# Patient Record
Sex: Female | Born: 1995 | Race: Black or African American | Hispanic: No | Marital: Single | Smoking: Never smoker
Health system: Southern US, Community
[De-identification: ages and names within clinical notes are randomized; demographics above are authoritative.]

## PROBLEM LIST (undated history)

## (undated) ENCOUNTER — Ambulatory Visit (HOSPITAL_COMMUNITY)
Admission: RE | Disposition: A | Discharge status: Home / Self Care | Payer: PRIVATE HEALTH INSURANCE | Source: Ambulatory Visit | Attending: Family Medicine | Admitting: Family Medicine

## (undated) DIAGNOSIS — Z789 Other specified health status: Secondary | ICD-10-CM

## (undated) HISTORY — PX: WISDOM TOOTH EXTRACTION: SHX21

---
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
## 2016-03-21 ENCOUNTER — Encounter (HOSPITAL_COMMUNITY): Payer: Self-pay | Admitting: Family Medicine

## 2016-03-21 ENCOUNTER — Ambulatory Visit (HOSPITAL_COMMUNITY)
Admission: EM | Admit: 2016-03-21 | Discharge: 2016-03-21 | Disposition: A | Discharge status: Home / Self Care | Payer: No Typology Code available for payment source | Attending: Family Medicine | Admitting: Family Medicine

## 2016-03-21 DIAGNOSIS — R109 Unspecified abdominal pain: Secondary | ICD-10-CM | POA: Diagnosis not present

## 2016-03-21 DIAGNOSIS — N938 Other specified abnormal uterine and vaginal bleeding: Secondary | ICD-10-CM

## 2016-03-21 NOTE — ED Provider Notes (Addendum)
[facility]    CSN: [id number] Arrival date & time: 03/21/16  1840     History   Chief Complaint Chief Complaint  Patient presents with  . Abdominal Pain  . Vaginal Bleeding    HPI Julia Hardy is a 21 y.o. female.   The history is provided by the patient.  Abdominal Pain  Pain location:  Suprapubic Pain quality: cramping   Pain radiates to:  Does not radiate Pain severity:  Mild Onset quality:  Gradual Duration:  3 days Progression:  Partially resolved Chronicity:  New Context comment:  Lesbian on no bcp, lighter bleeding than nl menses Relieved by:  None tried Worsened by:  Nothing Ineffective treatments:  None tried Associated symptoms: vaginal bleeding   Associated symptoms: no dysuria, no nausea, no vaginal discharge and no vomiting   Vaginal Bleeding  Associated symptoms: abdominal pain   Associated symptoms: no dysuria, no nausea and no vaginal discharge     History reviewed. No pertinent past medical history.  Patient Active Problem List   Diagnosis Date Noted  . Anal fissure 09/19/2013    History reviewed. No pertinent surgical history.  OB History    No data available       Home Medications    Prior to Admission medications   Medication Sig Start Date End Date Taking? Authorizing Provider  cephALEXin (KEFLEX) 500 MG capsule Take 1 capsule (500 mg total) by mouth 4 (four) times daily. 09/21/15   Roxy Horseman, PA-C  clotrimazole (LOTRIMIN) 1 % external solution Apply 1 application topically 2 (two) times daily. In between toes 06/08/15   Asencion Islam, DPM    Family History Family History  Problem Relation Age of Onset  . Diabetes Mother     Social History Social History  Substance Use Topics  . Smoking status: Never Smoker  . Smokeless tobacco: Never Used  . Alcohol use No     Allergies   Patient has no known allergies.   Review of Systems Review of Systems  Constitutional: Negative.   Gastrointestinal:  Positive for abdominal pain. Negative for nausea and vomiting.  Genitourinary: Positive for menstrual problem and vaginal bleeding. Negative for dysuria, flank pain, pelvic pain, urgency and vaginal discharge.  All other systems reviewed and are negative.    Physical Exam Triage Vital Signs ED Triage Vitals [03/21/16 2031]  Enc Vitals Group     BP 126/71     Pulse Rate 76     Resp 18     Temp 98.3 F (36.8 C)     Temp src      SpO2 100 %     Weight      Height      Head Circumference      Peak Flow      Pain Score      Pain Loc      Pain Edu?      Excl. in GC?    No data found.   Updated Vital Signs BP 126/71   Pulse 76   Temp 98.3 F (36.8 C)   Resp 18   LMP 03/21/2016   SpO2 100%   Visual Acuity Right Eye Distance:   Left Eye Distance:   Bilateral Distance:    Right Eye Near:   Left Eye Near:    Bilateral Near:     Physical Exam  Constitutional: She is oriented to person, place, and time. She appears well-developed and well-nourished. No distress.  Abdominal: Soft. Bowel sounds  are normal. She exhibits no distension. There is no tenderness.  Musculoskeletal: Normal range of motion.  Neurological: She is alert and oriented to person, place, and time.  Skin: Skin is warm and dry.  Nursing note and vitals reviewed.    UC Treatments / Results  Labs (all labs ordered are listed, but only abnormal results are displayed) Labs Reviewed  URINE CYTOLOGY ANCILLARY ONLY  URINE CYTOLOGY ANCILLARY ONLY    EKG  EKG Interpretation None       Radiology No results found.  Procedures Procedures (including critical care time)  Medications Ordered in UC Medications - No data to display   Initial Impression / Assessment and Plan / UC Course  I have reviewed the triage vital signs and the nursing notes.  Pertinent labs & imaging results that were available during my care of the patient were reviewed by me and considered in my medical decision making  (see chart for details).       Final Clinical Impressions(s) / UC Diagnoses   Final diagnoses:  DUB (dysfunctional uterine bleeding)    New Prescriptions Discharge Medication List as of 03/21/2016  9:12 PM       Linna HoffJames D Laraina Sulton, MD 04/09/16 1308    Linna HoffJames D Zaniya Mcaulay, MD 04/09/16 1309

## 2016-03-21 NOTE — ED Triage Notes (Signed)
Pt here for vaginal bleeding and abd cramping. sts that she already had her menstrual on the 24th of December.

## 2016-03-21 NOTE — Discharge Instructions (Signed)
We will call with test results and treat as indicated °

## 2016-03-22 LAB — URINE CYTOLOGY ANCILLARY ONLY
Chlamydia: NEGATIVE
Neisseria Gonorrhea: NEGATIVE
Trichomonas: NEGATIVE

## 2016-03-24 LAB — URINE CYTOLOGY ANCILLARY ONLY: BACTERIAL VAGINITIS: NEGATIVE

## 2016-10-09 ENCOUNTER — Encounter (HOSPITAL_COMMUNITY): Payer: Self-pay | Admitting: *Deleted

## 2016-10-09 ENCOUNTER — Ambulatory Visit (HOSPITAL_COMMUNITY)
Admission: EM | Admit: 2016-10-09 | Discharge: 2016-10-09 | Disposition: A | Discharge status: Home / Self Care | Payer: PRIVATE HEALTH INSURANCE | Attending: Emergency Medicine | Admitting: Emergency Medicine

## 2016-10-09 DIAGNOSIS — R1032 Left lower quadrant pain: Secondary | ICD-10-CM | POA: Insufficient documentation

## 2016-10-09 DIAGNOSIS — R1030 Lower abdominal pain, unspecified: Secondary | ICD-10-CM

## 2016-10-09 DIAGNOSIS — Z3202 Encounter for pregnancy test, result negative: Secondary | ICD-10-CM

## 2016-10-09 DIAGNOSIS — N939 Abnormal uterine and vaginal bleeding, unspecified: Secondary | ICD-10-CM | POA: Diagnosis present

## 2016-10-09 LAB — POCT URINALYSIS DIP (DEVICE)
Bilirubin Urine: NEGATIVE
GLUCOSE, UA: NEGATIVE mg/dL
HGB URINE DIPSTICK: NEGATIVE
Ketones, ur: NEGATIVE mg/dL
LEUKOCYTES UA: NEGATIVE
NITRITE: NEGATIVE
PROTEIN: NEGATIVE mg/dL
SPECIFIC GRAVITY, URINE: 1.015 (ref 1.005–1.030)
UROBILINOGEN UA: 1 mg/dL (ref 0.0–1.0)
pH: 7.5 (ref 5.0–8.0)

## 2016-10-09 LAB — POCT PREGNANCY, URINE: Preg Test, Ur: NEGATIVE

## 2016-10-09 MED ORDER — NAPROXEN 500 MG PO TABS: 500.0000 mg | ORAL_TABLET | Freq: Two times a day (BID) | ORAL | 0 refills | Status: DC

## 2016-10-09 NOTE — ED Triage Notes (Signed)
Pt  Reports   Irregular  Period       Had  Pain with   Sex  Last  Pm

## 2016-10-09 NOTE — Discharge Instructions (Signed)
You will need to follow up with an obgyn for further evaluation and possible ultrasound May need to discuss birth control options Have protected sex We will send the urine off and if anything shows in the test we will call you,  Your urine is negative today Take the pain meds as needed

## 2016-10-09 NOTE — ED Provider Notes (Signed)
CSN: [id number]     Arrival date & time 10/09/16  1542 History   First MD Initiated Contact with Patient 10/09/16 1747     Chief Complaint  Patient presents with  . Vaginal Bleeding   (Consider location/radiation/quality/duration/timing/severity/associated sxs/prior Treatment) Pt states that she has unprotected sexual intercourse on sat , post intercourse she has some bleeding with lower abd pain. Denies any bleeding currently or abd pain. Pt denies any vaginal discharge at this time. Does have any obgyn that she sees. Pt denies using any birth control. LMP was normal on July 12, 18.       History reviewed. No pertinent past medical history. History reviewed. No pertinent surgical history. Family History  Problem Relation Age of Onset  . Diabetes Mother    Social History  Substance Use Topics  . Smoking status: Never Smoker  . Smokeless tobacco: Never Used  . Alcohol use Yes   OB History    No data available     Review of Systems  Constitutional: Negative.   Respiratory: Negative.   Cardiovascular: Negative.   Gastrointestinal: Negative.   Genitourinary:       Currently no bleeding or abd pain   Musculoskeletal: Negative.   Skin: Negative.   Neurological: Negative.     Allergies  Patient has no known allergies.  Home Medications   Prior to Admission medications   Medication Sig Start Date End Date Taking? Authorizing Provider  cephALEXin (KEFLEX) 500 MG capsule Take 1 capsule (500 mg total) by mouth 4 (four) times daily. 09/21/15   Roxy HorsemanBrowning, Robert, PA-C  clotrimazole (LOTRIMIN) 1 % external solution Apply 1 application topically 2 (two) times daily. In between toes 06/08/15   Asencion IslamStover, Titorya, DPM  naproxen (NAPROSYN) 500 MG tablet Take 1 tablet (500 mg total) by mouth 2 (two) times daily. 10/09/16   Tobi BastosMitchell, Kaiyah Eber A, NP   Meds Ordered and Administered this Visit  Medications - No data to display  BP 122/70 (BP Location: Right Arm)   Pulse 78   Temp 98.5 F  (36.9 C) (Oral)   Resp 18   LMP 09/21/2016   SpO2 100%  No data found.   Physical Exam  Constitutional: She appears well-developed.  Cardiovascular: Normal rate and regular rhythm.   Pulmonary/Chest: Effort normal and breath sounds normal.  Abdominal: Soft. Bowel sounds are normal.  Genitourinary: Vagina normal.  Musculoskeletal: Normal range of motion.  Neurological: She is alert.  Skin: Skin is warm. Capillary refill takes less than 2 seconds.    Urgent Care Course     Procedures (including critical care time)  Labs Review Labs Reviewed  POCT URINALYSIS DIP (DEVICE)  URINE CYTOLOGY ANCILLARY ONLY    Imaging Review No results found.          MDM   1. Vaginal bleeding   2. Abdominal pain, left lower quadrant    You will need to follow up with an obgyn for further evaluation and possible ultrasound May need to discuss birth control options Have protected sex We will send the urine off and if anything shows in the test we will call you,  Your urine is negative today Take the pain meds as needed   Tobi BastosMitchell, Jaxyn Mestas A, NP 10/09/16 1820

## 2016-10-10 LAB — URINE CYTOLOGY ANCILLARY ONLY
Chlamydia: NEGATIVE
NEISSERIA GONORRHEA: NEGATIVE
Trichomonas: NEGATIVE

## 2017-01-09 ENCOUNTER — Encounter (HOSPITAL_COMMUNITY): Payer: Self-pay | Admitting: Family Medicine

## 2017-01-09 ENCOUNTER — Ambulatory Visit (HOSPITAL_COMMUNITY)
Admission: EM | Admit: 2017-01-09 | Discharge: 2017-01-09 | Disposition: A | Discharge status: Home / Self Care | Payer: No Typology Code available for payment source | Attending: Family Medicine | Admitting: Family Medicine

## 2017-01-09 DIAGNOSIS — Z202 Contact with and (suspected) exposure to infections with a predominantly sexual mode of transmission: Secondary | ICD-10-CM

## 2017-01-09 DIAGNOSIS — N938 Other specified abnormal uterine and vaginal bleeding: Secondary | ICD-10-CM

## 2017-01-09 DIAGNOSIS — R300 Dysuria: Secondary | ICD-10-CM

## 2017-01-09 DIAGNOSIS — N898 Other specified noninflammatory disorders of vagina: Secondary | ICD-10-CM

## 2017-01-09 DIAGNOSIS — Z3202 Encounter for pregnancy test, result negative: Secondary | ICD-10-CM

## 2017-01-09 LAB — POCT URINALYSIS DIP (DEVICE)
Bilirubin Urine: NEGATIVE
GLUCOSE, UA: NEGATIVE mg/dL
Ketones, ur: NEGATIVE mg/dL
LEUKOCYTES UA: NEGATIVE
NITRITE: NEGATIVE
PROTEIN: NEGATIVE mg/dL
Specific Gravity, Urine: 1.02 (ref 1.005–1.030)
UROBILINOGEN UA: 0.2 mg/dL (ref 0.0–1.0)
pH: 7 (ref 5.0–8.0)

## 2017-01-09 LAB — POCT PREGNANCY, URINE: Preg Test, Ur: NEGATIVE

## 2017-01-09 MED ORDER — AZITHROMYCIN 250 MG PO TABS
ORAL_TABLET | ORAL | Status: AC
  Filled 2017-01-09: qty 4

## 2017-01-09 MED ORDER — AZITHROMYCIN 250 MG PO TABS
1000.0000 mg | ORAL_TABLET | Freq: Once | ORAL | Status: AC
  Administered 2017-01-09: 1000 mg via ORAL

## 2017-01-09 MED ORDER — CEFTRIAXONE SODIUM 250 MG IJ SOLR
250.0000 mg | Freq: Once | INTRAMUSCULAR | Status: AC
  Administered 2017-01-09: 250 mg via INTRAMUSCULAR

## 2017-01-09 MED ORDER — LIDOCAINE HCL (PF) 1 % IJ SOLN
INTRAMUSCULAR | Status: AC
  Filled 2017-01-09: qty 2

## 2017-01-09 MED ORDER — CEFTRIAXONE SODIUM 250 MG IJ SOLR
INTRAMUSCULAR | Status: AC
  Filled 2017-01-09: qty 250

## 2017-01-09 NOTE — ED Triage Notes (Signed)
Pt here for spotting. Reports she is due for menses. sts also odor. Reports she would like to be checked for STD.

## 2017-01-09 NOTE — ED Provider Notes (Signed)
[facility]    CSN: [id number] Arrival date & time: 01/09/17  1240     History   Chief Complaint Chief Complaint  Patient presents with  . Vaginal Bleeding  . Vaginal Discharge    HPI Julia Hardy is a 21 y.o. female.   21 year-old female, presenting today for STD check. Patient states that she has had intermittent spotting and vaginal discharge that is malodorous over the past two weeks. She did have unprotected sex about a month ago. She is concerned about the possibility of STDs. Patient started her menstrual cycle today   The history is provided by the patient.  Vaginal Bleeding  Quality:  Spotting Severity:  Mild Onset quality:  Gradual Duration:  2 weeks Timing:  Intermittent Progression:  Unchanged Chronicity:  New Menstrual history:  Regular Possible pregnancy: no   Context: not after intercourse, not after urination and not at rest   Relieved by:  Nothing Worsened by:  Nothing Ineffective treatments:  None tried Associated symptoms: vaginal discharge   Associated symptoms: no abdominal pain, no back pain, no dizziness, no dysuria, no fatigue and no fever   Risk factors: STD exposure (possible ) and unprotected sex   Risk factors: no bleeding disorder, no hx of ectopic pregnancy, no hx of endometriosis, no gynecological surgery and does not have multiple partners     History reviewed. No pertinent past medical history.  Patient Active Problem List   Diagnosis Date Noted  . Anal fissure 09/19/2013    History reviewed. No pertinent surgical history.  OB History    No data available       Home Medications    Prior to Admission medications   Medication Sig Start Date End Date Taking? Authorizing Provider  cephALEXin (KEFLEX) 500 MG capsule Take 1 capsule (500 mg total) by mouth 4 (four) times daily. 09/21/15   Roxy Horseman, PA-C  clotrimazole (LOTRIMIN) 1 % external solution Apply 1 application topically 2 (two) times daily. In  between toes 06/08/15   Asencion Islam, DPM  naproxen (NAPROSYN) 500 MG tablet Take 1 tablet (500 mg total) by mouth 2 (two) times daily. 10/09/16   Coralyn Mark, NP    Family History Family History  Problem Relation Age of Onset  . Diabetes Mother     Social History Social History  Substance Use Topics  . Smoking status: Never Smoker  . Smokeless tobacco: Never Used  . Alcohol use Yes     Allergies   Patient has no known allergies.   Review of Systems Review of Systems  Constitutional: Negative for chills, fatigue and fever.  HENT: Negative for ear pain and sore throat.   Eyes: Negative for pain and visual disturbance.  Respiratory: Negative for cough and shortness of breath.   Cardiovascular: Negative for chest pain and palpitations.  Gastrointestinal: Negative for abdominal pain and vomiting.  Genitourinary: Positive for vaginal bleeding and vaginal discharge. Negative for dysuria and hematuria.  Musculoskeletal: Negative for arthralgias and back pain.  Skin: Negative for color change and rash.  Neurological: Negative for dizziness, seizures and syncope.  All other systems reviewed and are negative.    Physical Exam Triage Vital Signs ED Triage Vitals [01/09/17 1302]  Enc Vitals Group     BP 121/71     Pulse Rate 70     Resp 18     Temp 98.4 F (36.9 C)     Temp src      SpO2 100 %  Weight      Height      Head Circumference      Peak Flow      Pain Score      Pain Loc      Pain Edu?      Excl. in GC?    No data found.   Updated Vital Signs BP 121/71   Pulse 70   Temp 98.4 F (36.9 C)   Resp 18   LMP 12/10/2016   SpO2 100%   Visual Acuity Right Eye Distance:   Left Eye Distance:   Bilateral Distance:    Right Eye Near:   Left Eye Near:    Bilateral Near:     Physical Exam  Constitutional: She appears well-developed and well-nourished. No distress.  HENT:  Head: Normocephalic and atraumatic.  Eyes: Conjunctivae are normal.   Neck: Neck supple.  Cardiovascular: Normal rate and regular rhythm.   No murmur heard. Pulmonary/Chest: Effort normal and breath sounds normal. No respiratory distress.  Abdominal: Soft. There is no tenderness.  Musculoskeletal: She exhibits no edema.  Neurological: She is alert.  Skin: Skin is warm and dry.  Psychiatric: She has a normal mood and affect.  Nursing note and vitals reviewed.    UC Treatments / Results  Labs (all labs ordered are listed, but only abnormal results are displayed) Labs Reviewed  POCT URINALYSIS DIP (DEVICE) - Abnormal; Notable for the following:       Result Value   Hgb urine dipstick TRACE (*)    All other components within normal limits  POCT PREGNANCY, URINE  CERVICOVAGINAL ANCILLARY ONLY    EKG  EKG Interpretation None       Radiology No results found.  Procedures Procedures (including critical care time)  Medications Ordered in UC Medications  cefTRIAXone (ROCEPHIN) injection 250 mg (250 mg Intramuscular Given 01/09/17 1335)  azithromycin (ZITHROMAX) tablet 1,000 mg (1,000 mg Oral Given 01/09/17 1334)     Initial Impression / Assessment and Plan / UC Course  I have reviewed the triage vital signs and the nursing notes.  Pertinent labs & imaging results that were available during my care of the patient were reviewed by me and considered in my medical decision making (see chart for details).     Vaginal discharge - patient has no complaints of pelvic pain. Will allow her to self swab. UA without evidence of UTI Treated today with rocephin and azithro  Final Clinical Impressions(s) / UC Diagnoses   Final diagnoses:  Dysuria  Potential exposure to STD    New Prescriptions Discharge Medication List as of 01/09/2017  1:22 PM       Controlled Substance Prescriptions [place] Controlled Substance Registry consulted? Not Applicable   Alecia LemmingBlue, Caylah Plouff C, [place]PA-C 01/09/17 1420

## 2017-01-17 ENCOUNTER — Telehealth (HOSPITAL_COMMUNITY): Payer: Self-pay | Admitting: Emergency Medicine

## 2017-01-17 NOTE — Telephone Encounter (Signed)
Pt calling looking for lab results that were collected on October 30.  Pt was told we will call her back when we are able to investigate her labs.  Pt stated understanding.

## 2017-01-19 NOTE — Telephone Encounter (Signed)
Pt called wanting lab results.... Notified her that we do not have any results and I apologized.  ? Orlan LeavensKim L, RN was aware of this last week and brought this to my attention.... I tried to call her last week w/no success.... Pt sts she has changed her phone number since last visit.... Updated new # in Epic.  ? Provider note sts she was supposed to self-swab .... Pt sts nobody ever told her that info and left w/o giving us a sample... Sts she only gave a urine sample and that is all she has ever done in the past.  ? I again apologized to pt.... Pt was treated w/Azithro 1g and Rocephin 250 mg.  ? Pt is asymptomatic at the moment but wants to come in and do a self-swab but not get charged for it.  Adv her she can come in but would probably test negative since she was treated.  ? Pt verb understanding and was calm.

## 2017-01-21 ENCOUNTER — Telehealth (HOSPITAL_COMMUNITY): Payer: Self-pay | Admitting: Emergency Medicine

## 2017-01-21 DIAGNOSIS — Z202 Contact with and (suspected) exposure to infections with a predominantly sexual mode of transmission: Secondary | ICD-10-CM | POA: Insufficient documentation

## 2017-01-21 DIAGNOSIS — R300 Dysuria: Secondary | ICD-10-CM | POA: Insufficient documentation

## 2017-01-21 NOTE — Telephone Encounter (Signed)
Patient came in to give self-swab  Adv her it will be 2-3 days before we get results  Adv her to go on to MyChart to view results  Pt verb understanding.

## 2017-01-22 LAB — CERVICOVAGINAL ANCILLARY ONLY
Bacterial vaginitis: POSITIVE — AB
Candida vaginitis: NEGATIVE
Chlamydia: POSITIVE — AB
Neisseria Gonorrhea: NEGATIVE
Trichomonas: NEGATIVE

## 2017-01-26 ENCOUNTER — Telehealth (HOSPITAL_COMMUNITY): Payer: Self-pay | Admitting: Emergency Medicine

## 2017-01-26 MED ORDER — DOXYCYCLINE HYCLATE 100 MG PO CAPS: 100.0000 mg | ORAL_CAPSULE | Freq: Two times a day (BID) | ORAL | 0 refills | Status: DC

## 2017-01-26 NOTE — Telephone Encounter (Signed)
Called and LM on [phone] (M) Need to give lab results and to see how pt is doing from recent visit Also, notified pt to log on to MyChart to view important info Per Colin BroachKaren S, PA... Ok to call in Doxycycline 100 mg 1 tab PO BID #[id number] no refills.  Rx sent to [facility]([place]) Notified of pending Rx.  [facility]

## 2017-05-23 ENCOUNTER — Other Ambulatory Visit: Payer: Self-pay

## 2017-05-23 ENCOUNTER — Ambulatory Visit (HOSPITAL_COMMUNITY)
Admission: EM | Admit: 2017-05-23 | Discharge: 2017-05-23 | Disposition: A | Discharge status: Home / Self Care | Payer: Self-pay | Attending: Family Medicine | Admitting: Family Medicine

## 2017-05-23 ENCOUNTER — Encounter (HOSPITAL_COMMUNITY): Payer: Self-pay | Admitting: Emergency Medicine

## 2017-05-23 DIAGNOSIS — N3090 Cystitis, unspecified without hematuria: Secondary | ICD-10-CM

## 2017-05-23 LAB — POCT URINALYSIS DIP (DEVICE)
Bilirubin Urine: NEGATIVE
GLUCOSE, UA: NEGATIVE mg/dL
KETONES UR: NEGATIVE mg/dL
NITRITE: NEGATIVE
PH: 5.5 (ref 5.0–8.0)
Protein, ur: NEGATIVE mg/dL
Specific Gravity, Urine: 1.025 (ref 1.005–1.030)
Urobilinogen, UA: 1 mg/dL (ref 0.0–1.0)

## 2017-05-23 MED ORDER — CEPHALEXIN 500 MG PO CAPS: 500.0000 mg | ORAL_CAPSULE | Freq: Two times a day (BID) | ORAL | 0 refills | Status: DC

## 2017-05-23 NOTE — ED Triage Notes (Signed)
Pt states "I think I have a UTI" pt endorses discomfort with peeing.

## 2017-05-31 NOTE — ED Provider Notes (Signed)
  [facility]    ASSESSMENT & PLAN:  1. Cystitis     Meds ordered this encounter  Medications  . cephALEXin (KEFLEX) 500 MG capsule    Sig: Take 1 capsule (500 mg total) by mouth 2 (two) times daily.    Dispense:  10 capsule    Refill:  0   Urine culture sent. Will notify patient when results available. Will follow up with her PCP or here if not showing improvement over the next 48 hours, sooner if needed.  Outlined signs and symptoms indicating need for more acute intervention. Patient verbalized understanding. After Visit Summary given.  SUBJECTIVE:  Julia Hardy is a [age] y.o. female who complains of urinary frequency, urgency and dysuria for the past few days. No flank pain, fever, chills, abnormal vaginal discharge or bleeding. Hematuria: not present. Normal PO intake. No abdominal pain. No self treatment. Ambulatory without difficulty.  LMP: Patient's last menstrual period was 04/27/2017.  ROS: As in HPI.  OBJECTIVE:  Vitals:   05/23/17 1915  BP: 120/70  Pulse: 82  Resp: 18  Temp: 98.8 F (37.1 C)  SpO2: 100%   Appears well, in no apparent distress. Abdomen is soft without tenderness, guarding, mass, rebound or organomegaly. No CVA tenderness or inguinal adenopathy noted.  Labs Reviewed  POCT URINALYSIS DIP (DEVICE) - Abnormal; Notable for the following components:      Result Value   Hgb urine dipstick MODERATE (*)    Leukocytes, UA SMALL (*)    All other components within normal limits    No Known Allergies   Social History   Socioeconomic History  . Marital status: Single    Spouse name: Not on file  . Number of children: Not on file  . Years of education: Not on file  . Highest education level: Not on file  Occupational History  . Not on file  Social Needs  . Financial resource strain: Not on file  . Food insecurity:    Worry: Not on file    Inability: Not on file  . Transportation needs:    Medical: Not on file   Non-medical: Not on file  Tobacco Use  . Smoking status: Never Smoker  . Smokeless tobacco: Never Used  Substance and Sexual Activity  . Alcohol use: Yes  . Drug use: No  . Sexual activity: Yes  Lifestyle  . Physical activity:    Days per week: Not on file    Minutes per session: Not on file  . Stress: Not on file  Relationships  . Social connections:    Talks on phone: Not on file    Gets together: Not on file    Attends religious service: Not on file    Active member of club or organization: Not on file    Attends meetings of clubs or organizations: Not on file    Relationship status: Not on file  . Intimate partner violence:    Fear of current or ex partner: Not on file    Emotionally abused: Not on file    Physically abused: Not on file    Forced sexual activity: Not on file  Other Topics Concern  . Not on file  Social History Narrative  . Not on file   Family History  Problem Relation Age of Onset  . Diabetes Mother        Mardella LaymanHagler, Yobani Schertzer, MD 05/31/17 [phone]

## 2017-06-03 ENCOUNTER — Ambulatory Visit (HOSPITAL_COMMUNITY)
Admission: EM | Admit: 2017-06-03 | Discharge: 2017-06-03 | Disposition: A | Discharge status: Home / Self Care | Payer: Self-pay | Attending: Internal Medicine | Admitting: Internal Medicine

## 2017-06-03 ENCOUNTER — Other Ambulatory Visit: Payer: Self-pay

## 2017-06-03 DIAGNOSIS — R10814 Left lower quadrant abdominal tenderness: Secondary | ICD-10-CM

## 2017-06-03 LAB — POCT URINALYSIS DIP (DEVICE)
Bilirubin Urine: NEGATIVE
Glucose, UA: NEGATIVE mg/dL
HGB URINE DIPSTICK: NEGATIVE
Leukocytes, UA: NEGATIVE
NITRITE: NEGATIVE
PH: 6 (ref 5.0–8.0)
PROTEIN: NEGATIVE mg/dL
SPECIFIC GRAVITY, URINE: 1.025 (ref 1.005–1.030)
UROBILINOGEN UA: 0.2 mg/dL (ref 0.0–1.0)

## 2017-06-03 LAB — POCT PREGNANCY, URINE: Preg Test, Ur: NEGATIVE

## 2017-06-03 NOTE — ED Triage Notes (Signed)
Started last night, lower abd pain, stabbing sharp pain, last BM was yesterday.

## 2017-06-03 NOTE — Discharge Instructions (Signed)
Go to the ED

## 2017-06-03 NOTE — ED Provider Notes (Signed)
06/03/2017 4:58 PM   DOB: [birth year] / MRN: [id number]  SUBJECTIVE:  Julia Hardy is a 22 y.o. female presenting for abdominal pain that started about 24 hours ago.  Patient states that it hurts around her bellybutton.  Denies vaginal discharge and urinary symptoms.  She is never had surgery on her abdomen.  She denies fever at this time.  She feels she is getting worse.  Recent bowel movement was about 36 hours ago and was normal for her.  She has No Known Allergies.   She  has no past medical history on file.    She  reports that she has never smoked. She has never used smokeless tobacco. She reports that she drinks alcohol. She reports that she does not use drugs. She  reports that she currently engages in sexual activity. The patient  has no past surgical history on file.  Her family history includes Diabetes in her mother.  ROS HPI  OBJECTIVE:  BP 121/69 (BP Location: Left Arm)   Pulse 93   Temp 98.3 F (36.8 C) (Oral)   LMP 05/23/2017 (Approximate)   SpO2 96%   Physical Exam  Constitutional: She is active.  Non-toxic appearance.  Cardiovascular: Normal rate.  Pulmonary/Chest: Effort normal. No tachypnea.  Abdominal: Bowel sounds are normal. She exhibits no distension and no mass. There is tenderness (Right lower quadrant.  Positive obturator on the right side.). There is guarding. There is no rebound.  Neurological: She is alert.  Skin: Skin is warm and dry. She is not diaphoretic. No pallor.    Results for orders placed or performed during the hospital encounter of 06/03/17 (from the past 72 hour(s))  POCT urinalysis dip (device)     Status: Abnormal   Collection Time: 06/03/17  4:44 PM  Result Value Ref Range   Glucose, UA NEGATIVE NEGATIVE mg/dL   Bilirubin Urine NEGATIVE NEGATIVE   Ketones, ur TRACE (A) NEGATIVE mg/dL   Specific Gravity, Urine 1.025 1.005 - 1.030   Hgb urine dipstick NEGATIVE NEGATIVE   pH 6.0 5.0 - 8.0   Protein, ur NEGATIVE NEGATIVE mg/dL   Urobilinogen, UA 0.2 0.0 - 1.0 mg/dL   Nitrite NEGATIVE NEGATIVE   Leukocytes, UA NEGATIVE NEGATIVE    Comment: Biochemical Testing Only. Please order routine urinalysis from main lab if confirmatory testing is needed.  Pregnancy, urine POC     Status: None   Collection Time: 06/03/17  4:48 PM  Result Value Ref Range   Preg Test, Ur NEGATIVE NEGATIVE    Comment:        THE SENSITIVITY OF THIS METHODOLOGY IS >24 mIU/mL     No results found.  ASSESSMENT AND PLAN:  Orders Placed This Encounter  Procedures  . Pregnancy, urine POC    Standing Status:   Standing    Number of Occurrences:   1  . POCT urinalysis dip (device)    Standing Status:   Standing    Number of Occurrences:   1       Abdominal tenderness of left lower quadrant, rebound tenderness presence not specified:: I have advised the patient to go to the ED she has tenderness over McBurney's point.  She may need to see a surgeon tonight depending on what her CT scan shows.       The patient is advised to call or return to clinic if she does not see an improvement in symptoms, or to seek the care of the closest emergency department if she worsens  with the above plan.   Deliah BostonMichael Chan Rosasco, MHS, PA-C 06/03/2017 4:58 PM   Ofilia Neaslark, Velia Pamer L, PA-C 06/03/17 1700

## 2017-10-01 ENCOUNTER — Ambulatory Visit (HOSPITAL_COMMUNITY)
Admission: EM | Admit: 2017-10-01 | Discharge: 2017-10-01 | Disposition: A | Discharge status: Home / Self Care | Payer: Medicaid Other | Attending: Family Medicine | Admitting: Family Medicine

## 2017-10-01 ENCOUNTER — Encounter (HOSPITAL_COMMUNITY): Payer: Self-pay | Admitting: Emergency Medicine

## 2017-10-01 DIAGNOSIS — Z3202 Encounter for pregnancy test, result negative: Secondary | ICD-10-CM

## 2017-10-01 DIAGNOSIS — R300 Dysuria: Secondary | ICD-10-CM

## 2017-10-01 DIAGNOSIS — Z833 Family history of diabetes mellitus: Secondary | ICD-10-CM | POA: Insufficient documentation

## 2017-10-01 DIAGNOSIS — Z79899 Other long term (current) drug therapy: Secondary | ICD-10-CM | POA: Insufficient documentation

## 2017-10-01 DIAGNOSIS — N390 Urinary tract infection, site not specified: Secondary | ICD-10-CM | POA: Insufficient documentation

## 2017-10-01 LAB — POCT URINALYSIS DIP (DEVICE)
Bilirubin Urine: NEGATIVE
Glucose, UA: NEGATIVE mg/dL
KETONES UR: NEGATIVE mg/dL
NITRITE: NEGATIVE
Protein, ur: 30 mg/dL — AB
Specific Gravity, Urine: 1.02 (ref 1.005–1.030)
Urobilinogen, UA: 0.2 mg/dL (ref 0.0–1.0)
pH: 7.5 (ref 5.0–8.0)

## 2017-10-01 LAB — POCT PREGNANCY, URINE: PREG TEST UR: NEGATIVE

## 2017-10-01 MED ORDER — CEPHALEXIN 500 MG PO CAPS: 500.0000 mg | ORAL_CAPSULE | Freq: Two times a day (BID) | ORAL | 0 refills | Status: AC

## 2017-10-01 NOTE — Discharge Instructions (Signed)
Drink plenty of water to empty bladder regularly. Avoid alcohol and caffeine as these may irritate the bladder.  Complete course of antibiotics.   Will notify you of any positive findings from your vaginal sample and if any changes to treatment are needed.

## 2017-10-01 NOTE — ED Provider Notes (Signed)
[facility]    CSN: [id number] Arrival date & time: 10/01/17  1224     History   Chief Complaint Chief Complaint  Patient presents with  . Urinary Tract Infection    HPI Julia Hardy is a 22 y.o. female.   Julia Hardy presents with complaints of pain with urination and sensation of straining with urination which has been going on for the past 5-6 days but worse over the past 3 days. Urine is cloudy. No fevers. Has occasional pelvic cramping. Mild frequency of urination. States has some vaginal irritation and questions maybe yeast infection. No fevers. No back pain. Denies STD concern. She is sexually active with one partner and does not use condoms. Has had uti's in the past.    ROS per HPI.      History reviewed. No pertinent past medical history.  Patient Active Problem List   Diagnosis Date Noted  . Anal fissure 09/19/2013    History reviewed. No pertinent surgical history.  OB History   None      Home Medications    Prior to Admission medications   Medication Sig Start Date End Date Taking? Authorizing Provider  cephALEXin (KEFLEX) 500 MG capsule Take 1 capsule (500 mg total) by mouth 2 (two) times daily for 7 days. 10/01/17 10/08/17  Georgetta HaberBurky, Natalie B, NP    Family History Family History  Problem Relation Age of Onset  . Diabetes Mother     Social History Social History   Tobacco Use  . Smoking status: Never Smoker  . Smokeless tobacco: Never Used  Substance Use Topics  . Alcohol use: Yes  . Drug use: No     Allergies   Patient has no known allergies.   Review of Systems Review of Systems   Physical Exam Triage Vital Signs ED Triage Vitals [10/01/17 1239]  Enc Vitals Group     BP 108/73     Pulse Rate 76     Resp 18     Temp 98 F (36.7 C)     Temp Source Oral     SpO2 100 %     Weight      Height      Head Circumference      Peak Flow      Pain Score      Pain Loc      Pain Edu?      Excl. in GC?    No data  found.  Updated Vital Signs BP 108/73 (BP Location: Left Arm)   Pulse 76   Temp 98 F (36.7 C) (Oral)   Resp 18   SpO2 100%   Visual Acuity Right Eye Distance:   Left Eye Distance:   Bilateral Distance:    Right Eye Near:   Left Eye Near:    Bilateral Near:     Physical Exam  Constitutional: She is oriented to person, place, and time. She appears well-developed and well-nourished. No distress.  Cardiovascular: Normal rate, regular rhythm and normal heart sounds.  Pulmonary/Chest: Effort normal and breath sounds normal.  Abdominal: Soft. Bowel sounds are normal. There is no tenderness. There is no rigidity, no rebound, no guarding and no CVA tenderness.  Genitourinary:  Genitourinary Comments: Denies bleeding, no pelvic pain; no sores or lesions; vaginal cytology collected by patient   Neurological: She is alert and oriented to person, place, and time.  Skin: Skin is warm and dry.     UC Treatments / Results  Labs (all  labs ordered are listed, but only abnormal results are displayed) Labs Reviewed  POCT URINALYSIS DIP (DEVICE) - Abnormal; Notable for the following components:      Result Value   Hgb urine dipstick TRACE (*)    Protein, ur 30 (*)    Leukocytes, UA SMALL (*)    All other components within normal limits  URINE CULTURE  POCT PREGNANCY, URINE  CERVICOVAGINAL ANCILLARY ONLY    EKG None  Radiology No results found.  Procedures Procedures (including critical care time)  Medications Ordered in UC Medications - No data to display  Initial Impression / Assessment and Plan / UC Course  I have reviewed the triage vital signs and the nursing notes.  Pertinent labs & imaging results that were available during my care of the patient were reviewed by me and considered in my medical decision making (see chart for details).     leuks and hgb in urine with symptoms present. Will treat for uti. Vaginal cytology pending. Will notify of any positive findings  and if any changes to treatment are needed.  Patient verbalized understanding and agreeable to plan.    Final Clinical Impressions(s) / UC Diagnoses   Final diagnoses:  Lower urinary tract infectious disease     Discharge Instructions     Drink plenty of water to empty bladder regularly. Avoid alcohol and caffeine as these may irritate the bladder.  Complete course of antibiotics.   Will notify you of any positive findings from your vaginal sample and if any changes to treatment are needed.     ED Prescriptions    Medication Sig Dispense Auth. Provider   cephALEXin (KEFLEX) 500 MG capsule Take 1 capsule (500 mg total) by mouth 2 (two) times daily for 7 days. 14 capsule Georgetta Haber, NP     Controlled Substance Prescriptions [place] Controlled Substance Registry consulted? Not Applicable   Georgetta Haber, NP 10/01/17 1358

## 2017-10-01 NOTE — ED Triage Notes (Signed)
Pt sts UTI sx x 5 days  

## 2017-10-02 LAB — CERVICOVAGINAL ANCILLARY ONLY
Bacterial vaginitis: POSITIVE — AB
Candida vaginitis: NEGATIVE
Chlamydia: NEGATIVE
Neisseria Gonorrhea: NEGATIVE
TRICH (WINDOWPATH): NEGATIVE

## 2017-10-03 LAB — URINE CULTURE: Culture: >=100000 — AB

## 2017-10-06 ENCOUNTER — Telehealth (HOSPITAL_COMMUNITY): Payer: Self-pay

## 2017-10-06 MED ORDER — NITROFURANTOIN MONOHYD MACRO 100 MG PO CAPS: 100.0000 mg | ORAL_CAPSULE | Freq: Two times a day (BID) | ORAL | 0 refills | Status: DC

## 2017-10-06 NOTE — Telephone Encounter (Signed)
Urine culture positive for Staphylococcus Species. This was not treated at ucc visit. Rx for Macrobid 100 mg BID x 5 days. Sent to pharmacy of choice. Attempted to reach patient. No answer at this time. Voicemail left.

## 2017-10-09 ENCOUNTER — Telehealth (HOSPITAL_COMMUNITY): Payer: Self-pay

## 2017-10-09 NOTE — Telephone Encounter (Signed)
Attempted to reach patient x 2. No voicemail available. 

## 2017-10-09 NOTE — Telephone Encounter (Signed)
Attempted to reach patient x 3. Letter sent. 

## 2017-12-25 ENCOUNTER — Ambulatory Visit (HOSPITAL_COMMUNITY)
Admission: EM | Admit: 2017-12-25 | Discharge: 2017-12-25 | Disposition: A | Discharge status: Home / Self Care | Payer: Self-pay | Attending: Family Medicine | Admitting: Family Medicine

## 2017-12-25 ENCOUNTER — Encounter (HOSPITAL_COMMUNITY): Payer: Self-pay

## 2017-12-25 DIAGNOSIS — Z3202 Encounter for pregnancy test, result negative: Secondary | ICD-10-CM

## 2017-12-25 DIAGNOSIS — N898 Other specified noninflammatory disorders of vagina: Secondary | ICD-10-CM | POA: Insufficient documentation

## 2017-12-25 LAB — POCT PREGNANCY, URINE: PREG TEST UR: NEGATIVE

## 2017-12-25 MED ORDER — FLUCONAZOLE 150 MG PO TABS: 150.0000 mg | ORAL_TABLET | Freq: Every day | ORAL | 0 refills | Status: DC

## 2017-12-25 MED ORDER — METRONIDAZOLE 500 MG PO TABS: 500.0000 mg | ORAL_TABLET | Freq: Two times a day (BID) | ORAL | 0 refills | Status: DC

## 2017-12-25 NOTE — ED Triage Notes (Signed)
Pt presents with light discharge and odor.

## 2017-12-25 NOTE — ED Provider Notes (Signed)
[facility]    CSN: [id number] Arrival date & time: 12/25/17  1741     History   Chief Complaint Chief Complaint  Patient presents with  . Vaginitis    HPI Julia Hardy is a 22 y.o. female.   21 year old female comes in for 2-week history of vaginal discharge.  States discharge is thin, with odor.  Denies vaginal itching, pain, spotting.  Denies fever, chills, night sweats.  Denies abdominal pain, nausea, vomiting.  Denies urinary symptoms such as frequency, dysuria, hematuria.  LMP 12/06/2017.  Sexually active with one female partner, occasional condom use.     History reviewed. No pertinent past medical history.  Patient Active Problem List   Diagnosis Date Noted  . Anal fissure 09/19/2013    History reviewed. No pertinent surgical history.  OB History   None      Home Medications    Prior to Admission medications   Medication Sig Start Date End Date Taking? Authorizing Provider  fluconazole (DIFLUCAN) 150 MG tablet Take 1 tablet (150 mg total) by mouth daily. Take second dose 72 hours later if symptoms still persists. 12/25/17   Cathie Hoops, Shaquaya Wuellner V, PA-C  metroNIDAZOLE (FLAGYL) 500 MG tablet Take 1 tablet (500 mg total) by mouth 2 (two) times daily. 12/25/17   Belinda Fisher, PA-C    Family History Family History  Problem Relation Age of Onset  . Diabetes Mother     Social History Social History   Tobacco Use  . Smoking status: Never Smoker  . Smokeless tobacco: Never Used  Substance Use Topics  . Alcohol use: Yes  . Drug use: No     Allergies   Patient has no known allergies.   Review of Systems Review of Systems  Reason unable to perform ROS: See HPI as above.     Physical Exam Triage Vital Signs ED Triage Vitals  Enc Vitals Group     BP 12/25/17 1752 114/83     Pulse Rate 12/25/17 1752 80     Resp 12/25/17 1752 16     Temp 12/25/17 1752 98.3 F (36.8 C)     Temp Source 12/25/17 1752 Oral     SpO2 12/25/17 1752 100 %     Weight --       Height --      Head Circumference --      Peak Flow --      Pain Score 12/25/17 1757 0     Pain Loc --      Pain Edu? --      Excl. in GC? --    No data found.  Updated Vital Signs BP 114/83 (BP Location: Left Arm)   Pulse 80   Temp 98.3 F (36.8 C) (Oral)   Resp 16   LMP 12/06/2017   SpO2 100%   Physical Exam  Constitutional: She is oriented to person, place, and time. She appears well-developed and well-nourished. No distress.  HENT:  Head: Normocephalic and atraumatic.  Eyes: Pupils are equal, round, and reactive to light. Conjunctivae are normal.  Cardiovascular: Normal rate, regular rhythm and normal heart sounds. Exam reveals no gallop and no friction rub.  No murmur heard. Pulmonary/Chest: Effort normal and breath sounds normal. No stridor. No respiratory distress. She has no wheezes. She has no rales.  Abdominal: Soft. Bowel sounds are normal. She exhibits no mass. There is no tenderness. There is no rebound, no guarding and no CVA tenderness.  Neurological: She is alert and  oriented to person, place, and time.  Skin: Skin is warm and dry.  Psychiatric: She has a normal mood and affect. Her behavior is normal. Judgment normal.     UC Treatments / Results  Labs (all labs ordered are listed, but only abnormal results are displayed) Labs Reviewed  POCT PREGNANCY, URINE  CERVICOVAGINAL ANCILLARY ONLY    EKG None  Radiology No results found.  Procedures Procedures (including critical care time)  Medications Ordered in UC Medications - No data to display  Initial Impression / Assessment and Plan / UC Course  I have reviewed the triage vital signs and the nursing notes.  Pertinent labs & imaging results that were available during my care of the patient were reviewed by me and considered in my medical decision making (see chart for details).    Patient was treated empirically for BV and yeast. Diflucan and flagyl as directed. Cytology sent, patient  will be contacted with any positive results that require additional treatment. Patient to refrain from sexual activity for the next 7 days. Return precautions given.   Final Clinical Impressions(s) / UC Diagnoses   Final diagnoses:  Vaginal discharge    ED Prescriptions    Medication Sig Dispense Auth. Provider   metroNIDAZOLE (FLAGYL) 500 MG tablet Take 1 tablet (500 mg total) by mouth 2 (two) times daily. 14 tablet Adabella Stanis V, PA-C   fluconazole (DIFLUCAN) 150 MG tablet Take 1 tablet (150 mg total) by mouth daily. Take second dose 72 hours later if symptoms still persists. 2 tablet Threasa Alpha, PA-C 12/25/17 [phone]

## 2017-12-25 NOTE — Discharge Instructions (Signed)
You were treated empirically for yeast and bacterial vaginitis. Start flagyl and diflucan as directed. Cytology sent, you will be contacted with any positive results that requires further treatment. Refrain from sexual activity and alcohol use for the next 7 days. Monitor for any worsening of symptoms, fever, abdominal pain, nausea, vomiting, to follow up for reevaluation.

## 2017-12-26 LAB — CERVICOVAGINAL ANCILLARY ONLY
Chlamydia: NEGATIVE
Neisseria Gonorrhea: NEGATIVE
Trichomonas: NEGATIVE

## 2018-04-18 ENCOUNTER — Encounter (HOSPITAL_COMMUNITY): Payer: Self-pay | Admitting: Emergency Medicine

## 2018-04-18 ENCOUNTER — Ambulatory Visit (HOSPITAL_COMMUNITY)
Admission: EM | Admit: 2018-04-18 | Discharge: 2018-04-18 | Disposition: A | Discharge status: Home / Self Care | Payer: Self-pay | Attending: Internal Medicine | Admitting: Internal Medicine

## 2018-04-18 DIAGNOSIS — H1013 Acute atopic conjunctivitis, bilateral: Secondary | ICD-10-CM

## 2018-04-18 DIAGNOSIS — J309 Allergic rhinitis, unspecified: Secondary | ICD-10-CM

## 2018-04-18 DIAGNOSIS — N939 Abnormal uterine and vaginal bleeding, unspecified: Secondary | ICD-10-CM

## 2018-04-18 LAB — POCT URINALYSIS DIP (DEVICE)
Bilirubin Urine: NEGATIVE
GLUCOSE, UA: NEGATIVE mg/dL
Ketones, ur: NEGATIVE mg/dL
NITRITE: NEGATIVE
PH: 7 (ref 5.0–8.0)
PROTEIN: NEGATIVE mg/dL
Specific Gravity, Urine: 1.02 (ref 1.005–1.030)
UROBILINOGEN UA: 0.2 mg/dL (ref 0.0–1.0)

## 2018-04-18 LAB — POCT PREGNANCY, URINE: Preg Test, Ur: NEGATIVE

## 2018-04-18 MED ORDER — LORATADINE 10 MG PO TABS: 10.0000 mg | ORAL_TABLET | Freq: Every day | ORAL | 1 refills | Status: DC | PRN

## 2018-04-18 NOTE — ED Provider Notes (Signed)
[facility]    CSN: [id number] Arrival date & time: 04/18/18  1714     History   Chief Complaint Chief Complaint  Patient presents with  . Burning Sensation  . Vaginal Bleeding    HPI Julia Hardy is a 23 y.o. female.   23 year old female with no chronic medical problems presents to urgent care complaining of vaginal burning and itching.  She also admits to some vaginal discharge that looks bloody.  The patient states that it is too early for her.  But that her cycle seems to have been off.  She denies fever but admits to nausea and vomiting.  She had one episode of nonbloody nonbilious emesis here in clinic.  She denies abdominal pain.     History reviewed. No pertinent past medical history.  Patient Active Problem List   Diagnosis Date Noted  . Anal fissure 09/19/2013    History reviewed. No pertinent surgical history.  OB History   No obstetric history on file.      Home Medications    Prior to Admission medications   Medication Sig Start Date End Date Taking? Authorizing Provider  fluconazole (DIFLUCAN) 150 MG tablet Take 1 tablet (150 mg total) by mouth daily. Take second dose 72 hours later if symptoms still persists. Patient not taking: Reported on 04/18/2018 12/25/17   Belinda FisherYu, Amy V, PA-C  loratadine (CLARITIN) 10 MG tablet Take 1 tablet (10 mg total) by mouth daily as needed (watery itchy eyes). 04/18/18   Arnaldo Nataliamond, Anaja Monts S, MD  metroNIDAZOLE (FLAGYL) 500 MG tablet Take 1 tablet (500 mg total) by mouth 2 (two) times daily. Patient not taking: Reported on 04/18/2018 12/25/17   Lurline IdolYu, Amy V, PA-C    Family History Family History  Problem Relation Age of Onset  . Diabetes Mother     Social History Social History   Tobacco Use  . Smoking status: Never Smoker  . Smokeless tobacco: Never Used  Substance Use Topics  . Alcohol use: Yes  . Drug use: No     Allergies   Patient has no known allergies.   Review of Systems Review of Systems    Constitutional: Negative for chills and fever.  HENT: Negative for sore throat and tinnitus.   Eyes: Positive for itching. Negative for redness.  Respiratory: Negative for cough and shortness of breath.   Cardiovascular: Negative for chest pain and palpitations.  Gastrointestinal: Negative for abdominal pain, diarrhea, nausea and vomiting.  Genitourinary: Positive for vaginal bleeding. Negative for dysuria, frequency and urgency.  Musculoskeletal: Negative for myalgias.  Skin: Negative for rash.       No lesions  Neurological: Negative for weakness.  Hematological: Does not bruise/bleed easily.  Psychiatric/Behavioral: Negative for suicidal ideas.     Physical Exam Triage Vital Signs ED Triage Vitals  Enc Vitals Group     BP 04/18/18 1744 118/76     Pulse Rate 04/18/18 1744 83     Resp 04/18/18 1744 18     Temp 04/18/18 1744 98.6 F (37 C)     Temp src --      SpO2 04/18/18 1744 98 %     Weight --      Height --      Head Circumference --      Peak Flow --      Pain Score 04/18/18 1747 0     Pain Loc --      Pain Edu? --      Excl. in  GC? --    No data found.  Updated Vital Signs BP 118/76   Pulse 83   Temp 98.6 F (37 C)   Resp 18   LMP 03/29/2018   SpO2 98%   Visual Acuity Right Eye Distance:   Left Eye Distance:   Bilateral Distance:    Right Eye Near:   Left Eye Near:    Bilateral Near:     Physical Exam Vitals signs and nursing note reviewed.  Constitutional:      General: She is not in acute distress.    Appearance: She is well-developed.  HENT:     Head: Normocephalic and atraumatic.  Eyes:     General: No scleral icterus.    Conjunctiva/sclera: Conjunctivae normal.     Pupils: Pupils are equal, round, and reactive to light.  Neck:     Musculoskeletal: Normal range of motion and neck supple.     Thyroid: No thyromegaly.     Vascular: No JVD.     Trachea: No tracheal deviation.  Cardiovascular:     Rate and Rhythm: Normal rate and  regular rhythm.     Heart sounds: Normal heart sounds. No murmur. No friction rub. No gallop.   Pulmonary:     Effort: Pulmonary effort is normal.     Breath sounds: Normal breath sounds.  Abdominal:     General: Bowel sounds are normal. There is no distension.     Palpations: Abdomen is soft.     Tenderness: There is no abdominal tenderness.  Musculoskeletal: Normal range of motion.  Lymphadenopathy:     Cervical: No cervical adenopathy.  Skin:    General: Skin is warm and dry.  Neurological:     Mental Status: She is alert and oriented to person, place, and time.     Cranial Nerves: No cranial nerve deficit.  Psychiatric:        Behavior: Behavior normal.        Thought Content: Thought content normal.        Judgment: Judgment normal.      UC Treatments / Results  Labs (all labs ordered are listed, but only abnormal results are displayed) Labs Reviewed  POCT URINALYSIS DIP (DEVICE) - Abnormal; Notable for the following components:      Result Value   Hgb urine dipstick LARGE (*)    Leukocytes, UA TRACE (*)    All other components within normal limits  POC URINE PREG, ED  POCT PREGNANCY, URINE  CERVICOVAGINAL ANCILLARY ONLY    EKG None  Radiology No results found.  Procedures Procedures (including critical care time)  Medications Ordered in UC Medications - No data to display  Initial Impression / Assessment and Plan / UC Course  I have reviewed the triage vital signs and the nursing notes.  Pertinent labs & imaging results that were available during my care of the patient were reviewed by me and considered in my medical decision making (see chart for details).     Urine with large amount of hematuria.  Urine pregnancy test negative but in light of nausea and vaginal bleeding it is likely patient has had a miscarriage.  She does not appear to be concerned about that but more so about itchy red eyes and scratchy throat.  I explained the patient that her  symptoms are consistent with allergies but she is reluctant to agree with this diagnosis.  Nonetheless I have prescribed Claritin which she may take at her discretion.  We will obtain  results of cervical vaginal swab and call in antibiotics if needed.  Final Clinical Impressions(s) / UC Diagnoses   Final diagnoses:  Vaginal bleeding  Allergic conjunctivitis and rhinitis, bilateral   Discharge Instructions   None    ED Prescriptions    Medication Sig Dispense Auth. Provider   loratadine (CLARITIN) 10 MG tablet Take 1 tablet (10 mg total) by mouth daily as needed (watery itchy eyes). 30 tablet Arnaldo Natal, MD     Controlled Substance Prescriptions [place] Controlled Substance Registry consulted? Not Applicable   Arnaldo Natal, MD 04/18/18 [phone]

## 2018-04-18 NOTE — ED Triage Notes (Signed)
Pt states she scratched her nose and it was burning yesterday. Pt states bilateral eyes were itching and burning. Started today. Pt also c/o vaginal bleeding, states shes "not on my period".

## 2018-04-21 LAB — CERVICOVAGINAL ANCILLARY ONLY
Bacterial vaginitis: POSITIVE — AB
Chlamydia: NEGATIVE
Neisseria Gonorrhea: NEGATIVE
Trichomonas: NEGATIVE

## 2018-04-23 ENCOUNTER — Telehealth (HOSPITAL_COMMUNITY): Payer: Self-pay | Admitting: Emergency Medicine

## 2018-04-23 MED ORDER — METRONIDAZOLE 500 MG PO TABS: 500.0000 mg | ORAL_TABLET | Freq: Two times a day (BID) | ORAL | 0 refills | Status: AC

## 2018-04-23 NOTE — Telephone Encounter (Signed)
Bacterial vaginosis is positive. This was not treated at the urgent care visit.  Flagyl 500 mg BID x 7 days #14 no refills sent to patients pharmacy of choice.    Patient contacted and made aware of all results, all questions answered.  

## 2018-05-28 ENCOUNTER — Encounter (HOSPITAL_COMMUNITY): Payer: Self-pay | Admitting: Emergency Medicine

## 2018-05-28 ENCOUNTER — Ambulatory Visit (HOSPITAL_COMMUNITY)
Admission: EM | Admit: 2018-05-28 | Discharge: 2018-05-28 | Disposition: A | Discharge status: Home / Self Care | Payer: Medicaid Other | Attending: Family Medicine | Admitting: Family Medicine

## 2018-05-28 DIAGNOSIS — Z3202 Encounter for pregnancy test, result negative: Secondary | ICD-10-CM

## 2018-05-28 DIAGNOSIS — N3001 Acute cystitis with hematuria: Secondary | ICD-10-CM | POA: Insufficient documentation

## 2018-05-28 LAB — POCT PREGNANCY, URINE: Preg Test, Ur: NEGATIVE

## 2018-05-28 LAB — POCT URINALYSIS DIP (DEVICE)
BILIRUBIN URINE: NEGATIVE
GLUCOSE, UA: NEGATIVE mg/dL
KETONES UR: NEGATIVE mg/dL
Nitrite: POSITIVE — AB
PROTEIN: 100 mg/dL — AB
Urobilinogen, UA: 1 mg/dL (ref 0.0–1.0)
pH: 6 (ref 5.0–8.0)

## 2018-05-28 MED ORDER — NITROFURANTOIN MONOHYD MACRO 100 MG PO CAPS: 100.0000 mg | ORAL_CAPSULE | Freq: Two times a day (BID) | ORAL | 0 refills | Status: DC

## 2018-05-28 NOTE — ED Provider Notes (Signed)
[facility]    CSN: [id number] Arrival date & time: 05/28/18  1232     History   Chief Complaint Chief Complaint  Patient presents with  . Urinary Tract Infection    HPI Julia Hardy is a 23 y.o. female.   HPI  Patient is here for urinary tract infection.  She states that she has burning when she urinates, and some frequency.  Its been going on for 5 days.  She has been trying to drink more fluids.  She took an Azo product over-the-counter.  No fever chills.  No nausea vomiting.  No flank pain.  No history of kidney stones or kidney infection.  Patient states she does not think she is pregnant, pregnancy test here today confirms not  History reviewed. No pertinent past medical history.  Patient Active Problem List   Diagnosis Date Noted  . Anal fissure 09/19/2013    History reviewed. No pertinent surgical history.  OB History   No obstetric history on file.      Home Medications    Prior to Admission medications   Medication Sig Start Date End Date Taking? Authorizing Provider  loratadine (CLARITIN) 10 MG tablet Take 1 tablet (10 mg total) by mouth daily as needed (watery itchy eyes). 04/18/18   Arnaldo Natal, MD  nitrofurantoin, macrocrystal-monohydrate, (MACROBID) 100 MG capsule Take 1 capsule (100 mg total) by mouth 2 (two) times daily. 05/28/18   Eustace Moore, MD    Family History Family History  Problem Relation Age of Onset  . Diabetes Mother     Social History Social History   Tobacco Use  . Smoking status: Never Smoker  . Smokeless tobacco: Never Used  Substance Use Topics  . Alcohol use: Yes  . Drug use: No     Allergies   Patient has no known allergies.   Review of Systems Review of Systems  Constitutional: Negative for chills and fever.  HENT: Negative for ear pain and sore throat.   Eyes: Negative for pain and visual disturbance.  Respiratory: Negative for cough and shortness of breath.   Cardiovascular: Negative  for chest pain and palpitations.  Gastrointestinal: Negative for abdominal pain and vomiting.  Genitourinary: Positive for dysuria and frequency. Negative for hematuria.  Musculoskeletal: Negative for arthralgias and back pain.  Skin: Negative for color change and rash.  Neurological: Negative for seizures and syncope.  All other systems reviewed and are negative.    Physical Exam Triage Vital Signs ED Triage Vitals [05/28/18 1356]  Enc Vitals Group     BP 125/70     Pulse Rate 91     Resp 18     Temp 98.1 F (36.7 C)     Temp Source Temporal     SpO2 100 %     Weight      Height      Head Circumference      Peak Flow      Pain Score 5     Pain Loc      Pain Edu?      Excl. in GC?    No data found.  Updated Vital Signs BP 125/70 (BP Location: Right Arm)   Pulse 91   Temp 98.1 F (36.7 C) (Temporal)   Resp 18   SpO2 100%   Visual Acuity Right Eye Distance:   Left Eye Distance:   Bilateral Distance:    Right Eye Near:   Left Eye Near:    Bilateral  Near:     Physical Exam Constitutional:      General: She is not in acute distress.    Appearance: She is well-developed.  HENT:     Head: Normocephalic and atraumatic.  Eyes:     Conjunctiva/sclera: Conjunctivae normal.     Pupils: Pupils are equal, round, and reactive to light.  Neck:     Musculoskeletal: Normal range of motion and neck supple.  Cardiovascular:     Rate and Rhythm: Normal rate and regular rhythm.     Heart sounds: Normal heart sounds.  Pulmonary:     Effort: Pulmonary effort is normal. No respiratory distress.  Abdominal:     General: Bowel sounds are normal. There is no distension.     Palpations: Abdomen is soft.     Comments: Abdomen soft and benign.  No CVAT  Musculoskeletal: Normal range of motion.  Skin:    General: Skin is warm and dry.  Neurological:     Mental Status: She is alert.      UC Treatments / Results  Labs (all labs ordered are listed, but only abnormal  results are displayed) Labs Reviewed  POCT URINALYSIS DIP (DEVICE) - Abnormal; Notable for the following components:      Result Value   Hgb urine dipstick LARGE (*)    Protein, ur 100 (*)    Nitrite POSITIVE (*)    Leukocytes,Ua MODERATE (*)    All other components within normal limits  URINE CULTURE  POC URINE PREG, ED  POCT PREGNANCY, URINE    EKG None  Radiology No results found.  Procedures Procedures (including critical care time)  Medications Ordered in UC Medications - No data to display  Initial Impression / Assessment and Plan / UC Course  I have reviewed the triage vital signs and the nursing notes.  Pertinent labs & imaging results that were available during my care of the patient were reviewed by me and considered in my medical decision making (see chart for details).      Final Clinical Impressions(s) / UC Diagnoses   Final diagnoses:  Acute cystitis with hematuria     Discharge Instructions     You need to drink more fluids. Take antibiotic 2 times a day for 5 days  We did lab testing during this visit.  If there are any abnormal findings that require change in medicine or indicate a positive result, you will be notified.  If all of your tests are normal, you will not be called.       ED Prescriptions    Medication Sig Dispense Auth. Provider   nitrofurantoin, macrocrystal-monohydrate, (MACROBID) 100 MG capsule Take 1 capsule (100 mg total) by mouth 2 (two) times daily. 10 capsule Eustace Moore, MD     Controlled Substance Prescriptions [place] Controlled Substance Registry consulted? Not Applicable   Eustace Moore, MD 05/28/18 [phone]

## 2018-05-28 NOTE — ED Triage Notes (Signed)
Pt here for UTI sx x 5 days  

## 2018-05-28 NOTE — Discharge Instructions (Signed)
You need to drink more fluids. Take antibiotic 2 times a day for 5 days  We did lab testing during this visit.  If there are any abnormal findings that require change in medicine or indicate a positive result, you will be notified.  If all of your tests are normal, you will not be called.

## 2018-05-30 LAB — URINE CULTURE: Culture: >=100000 — AB

## 2018-05-31 ENCOUNTER — Telehealth (HOSPITAL_COMMUNITY): Payer: Self-pay | Admitting: Emergency Medicine

## 2018-05-31 NOTE — Telephone Encounter (Signed)
Patient contacted and made aware of all results, all questions answered.   

## 2018-06-06 ENCOUNTER — Emergency Department (HOSPITAL_COMMUNITY)
Admission: EM | Admit: 2018-06-06 | Discharge: 2018-06-06 | Disposition: A | Discharge status: Home / Self Care | Payer: Medicaid Other | Attending: Emergency Medicine | Admitting: Emergency Medicine

## 2018-06-06 ENCOUNTER — Other Ambulatory Visit: Payer: Self-pay

## 2018-06-06 DIAGNOSIS — Z79899 Other long term (current) drug therapy: Secondary | ICD-10-CM | POA: Insufficient documentation

## 2018-06-06 DIAGNOSIS — R002 Palpitations: Secondary | ICD-10-CM | POA: Insufficient documentation

## 2018-06-06 NOTE — Discharge Instructions (Signed)
It was our pleasure to provide your ER care today - we hope that you feel better.  Currently, your heart rhythm is normal, and your ecg looks good.   If burping/heartburn/reflux symptoms, try taking pepcid and maalox as need for symptom relief.  Avoid caffeine use.   Follow up with primary care doctor in the next couple of weeks.  Return to ER if worse, new symptoms, persistent fast heart beat, trouble breathing, other concern.

## 2018-06-06 NOTE — ED Provider Notes (Signed)
[facility] EMERGENCY DEPARTMENT Provider Note   CSN: [id number] Arrival date & time: 06/06/18  1605    History   Chief Complaint Chief Complaint  Patient presents with  . Tachycardia    HPI Julia Hardy is a 23 y.o. female.     Patient c/o intermittent heart fluttering sensation for past 2 days. Symptoms acute onset, episodic, recurrent, moderate,  intermittent, last seconds. Symptoms occur at rest, no relation to activity or exertion. No syncope, no faintness or dizziness. No chest pain. No sob or unusual doe. Denies hx chd or dysrhythmia. Also c/o that night, at rest, while sleeping,  has sensation of wanting to burp, and ?heartburn. Denies any stimulant/drug use or heavy caffeine use.   The history is provided by the patient.    No past medical history on file.  Patient Active Problem List   Diagnosis Date Noted  . Anal fissure 09/19/2013    No past surgical history on file.   OB History   No obstetric history on file.      Home Medications    Prior to Admission medications   Medication Sig Start Date End Date Taking? Authorizing Provider  loratadine (CLARITIN) 10 MG tablet Take 1 tablet (10 mg total) by mouth daily as needed (watery itchy eyes). 04/18/18   Arnaldo Natal, MD  nitrofurantoin, macrocrystal-monohydrate, (MACROBID) 100 MG capsule Take 1 capsule (100 mg total) by mouth 2 (two) times daily. 05/28/18   Eustace Moore, MD    Family History Family History  Problem Relation Age of Onset  . Diabetes Mother     Social History Social History   Tobacco Use  . Smoking status: Never Smoker  . Smokeless tobacco: Never Used  Substance Use Topics  . Alcohol use: Yes  . Drug use: No     Allergies   Patient has no known allergies.   Review of Systems Review of Systems  Constitutional: Negative for fever.  HENT: Negative for sore throat.   Eyes: Negative for redness.  Respiratory: Negative for cough and shortness of  breath.   Cardiovascular: Positive for palpitations. Negative for chest pain and leg swelling.  Gastrointestinal: Negative for abdominal pain, diarrhea and vomiting.  Genitourinary: Negative for flank pain.  Musculoskeletal: Negative for back pain and neck pain.  Skin: Negative for rash.  Neurological: Negative for syncope and headaches.  Hematological: Does not bruise/bleed easily.  Psychiatric/Behavioral: Negative for confusion.     Physical Exam Updated Vital Signs BP 116/80 (BP Location: Right Arm)   Pulse 89   Temp 98.4 F (36.9 C) (Oral)   Ht 1.651 m (5\' 5" )   Wt 68 kg   LMP 05/27/2018   SpO2 100%   BMI 24.96 kg/m   Physical Exam Vitals signs and nursing note reviewed.  Constitutional:      Appearance: Normal appearance. She is well-developed.  HENT:     Head: Atraumatic.     Nose: Nose normal.     Mouth/Throat:     Mouth: Mucous membranes are moist.  Eyes:     General: No scleral icterus.    Conjunctiva/sclera: Conjunctivae normal.     Pupils: Pupils are equal, round, and reactive to light.  Neck:     Musculoskeletal: Normal range of motion and neck supple. No neck rigidity or muscular tenderness.     Trachea: No tracheal deviation.     Comments: No thyromegaly.  Cardiovascular:     Rate and Rhythm: Normal rate and regular  rhythm.     Pulses: Normal pulses.     Heart sounds: Normal heart sounds. No murmur. No friction rub. No gallop.   Pulmonary:     Effort: Pulmonary effort is normal. No respiratory distress.     Breath sounds: Normal breath sounds.  Abdominal:     General: Bowel sounds are normal. There is no distension.     Palpations: Abdomen is soft.     Tenderness: There is no abdominal tenderness. There is no guarding.  Genitourinary:    Comments: No cva tenderness.  Musculoskeletal:        General: No swelling.  Skin:    General: Skin is warm and dry.     Findings: No rash.  Neurological:     Mental Status: She is alert.     Comments:  Alert, speech normal. Steady gait.   Psychiatric:        Mood and Affect: Mood normal.      ED Treatments / Results  Labs (all labs ordered are listed, but only abnormal results are displayed) Labs Reviewed - No data to display  EKG EKG Interpretation  Date/Time:  Thursday June 06 2018 16:27:22 EDT Ventricular Rate:  79 PR Interval:  140 QRS Duration: 64 QT Interval:  360 QTC Calculation: 412 R Axis:   71 Text Interpretation:  Normal sinus rhythm Normal ECG No previous tracing Confirmed by Cathren Laine ([id number]) on 06/06/2018 4:31:02 PM   Radiology No results found.  Procedures Procedures (including critical care time)  Medications Ordered in ED Medications - No data to display   Initial Impression / Assessment and Plan / ED Course  I have reviewed the triage vital signs and the nursing notes.  Pertinent labs & imaging results that were available during my care of the patient were reviewed by me and considered in my medical decision making (see chart for details).  Monitor. Ecg.   ECG reviewed - sinus rhythm.   No pain or discomfort. No sob. No faintness.  Vitals normal.  Reviewed nursing notes and prior charts for additional history.   Patient currently appears stable for d/c.     Final Clinical Impressions(s) / ED Diagnoses   Final diagnoses:  None    ED Discharge Orders    None       Cathren Laine, MD 06/06/18 [phone]

## 2018-06-06 NOTE — ED Triage Notes (Signed)
Pt feeling heart fluttering for two days. Feels heart racing. Pulse 85 triage. Pt states she drinks 4 days a week. Drinks until "drunk" on those days

## 2018-11-04 ENCOUNTER — Inpatient Hospital Stay (HOSPITAL_COMMUNITY)
Admission: EM | Admit: 2018-11-04 | Discharge: 2018-11-05 | Disposition: A | Discharge status: Home / Self Care | Payer: Self-pay | Attending: Obstetrics & Gynecology | Admitting: Obstetrics & Gynecology

## 2018-11-04 ENCOUNTER — Other Ambulatory Visit: Payer: Self-pay

## 2018-11-04 ENCOUNTER — Encounter (HOSPITAL_COMMUNITY): Payer: Self-pay | Admitting: *Deleted

## 2018-11-04 DIAGNOSIS — B9689 Other specified bacterial agents as the cause of diseases classified elsewhere: Secondary | ICD-10-CM | POA: Insufficient documentation

## 2018-11-04 DIAGNOSIS — R109 Unspecified abdominal pain: Secondary | ICD-10-CM | POA: Insufficient documentation

## 2018-11-04 DIAGNOSIS — O26891 Other specified pregnancy related conditions, first trimester: Secondary | ICD-10-CM | POA: Insufficient documentation

## 2018-11-04 DIAGNOSIS — Z833 Family history of diabetes mellitus: Secondary | ICD-10-CM | POA: Insufficient documentation

## 2018-11-04 DIAGNOSIS — Z3A01 Less than 8 weeks gestation of pregnancy: Secondary | ICD-10-CM | POA: Insufficient documentation

## 2018-11-04 DIAGNOSIS — O209 Hemorrhage in early pregnancy, unspecified: Secondary | ICD-10-CM | POA: Insufficient documentation

## 2018-11-04 DIAGNOSIS — O23591 Infection of other part of genital tract in pregnancy, first trimester: Secondary | ICD-10-CM | POA: Insufficient documentation

## 2018-11-04 HISTORY — DX: Other specified health status: Z78.9

## 2018-11-04 LAB — I-STAT BETA HCG BLOOD, ED (MC, WL, AP ONLY): I-stat hCG, quantitative: >2000 m[IU]/mL — ABNORMAL HIGH (ref <5)

## 2018-11-04 NOTE — MAU Note (Signed)
Pt reports to MAU c/o vaginal bleeding that was earlier today that has progressed to brown dc. Pt reports some abdominal cramping earlier none now. Pt reports sex 2 days ago. LMP July 9th.

## 2018-11-04 NOTE — ED Triage Notes (Signed)
Pt with abdominal cramping, vomiting, and brown discharge, LMP in July confirmed at home. MAU notified, HCG being drawn then for transport if positive.

## 2018-11-05 ENCOUNTER — Inpatient Hospital Stay (HOSPITAL_COMMUNITY): Payer: Self-pay

## 2018-11-05 DIAGNOSIS — O23591 Infection of other part of genital tract in pregnancy, first trimester: Secondary | ICD-10-CM

## 2018-11-05 DIAGNOSIS — B9689 Other specified bacterial agents as the cause of diseases classified elsewhere: Secondary | ICD-10-CM

## 2018-11-05 DIAGNOSIS — N760 Acute vaginitis: Secondary | ICD-10-CM

## 2018-11-05 DIAGNOSIS — Z3A01 Less than 8 weeks gestation of pregnancy: Secondary | ICD-10-CM

## 2018-11-05 DIAGNOSIS — O209 Hemorrhage in early pregnancy, unspecified: Secondary | ICD-10-CM

## 2018-11-05 LAB — URINALYSIS, ROUTINE W REFLEX MICROSCOPIC
Bilirubin Urine: NEGATIVE
Glucose, UA: 50 mg/dL — AB
Ketones, ur: NEGATIVE mg/dL
Leukocytes,Ua: NEGATIVE
Nitrite: NEGATIVE
Protein, ur: NEGATIVE mg/dL
Specific Gravity, Urine: 1.026 (ref 1.005–1.030)
pH: 6 (ref 5.0–8.0)

## 2018-11-05 LAB — CBC
HCT: 39 % (ref 36.0–46.0)
Hemoglobin: 13.5 g/dL (ref 12.0–15.0)
MCH: 30.4 pg (ref 26.0–34.0)
MCHC: 34.6 g/dL (ref 30.0–36.0)
MCV: 87.8 fL (ref 80.0–100.0)
Platelets: 256 10*3/uL (ref 150–400)
RBC: 4.44 MIL/uL (ref 3.87–5.11)
RDW: 12.3 % (ref 11.5–15.5)
WBC: 7.3 10*3/uL (ref 4.0–10.5)
nRBC: 0 % (ref 0.0–0.2)

## 2018-11-05 LAB — WET PREP, GENITAL
Sperm: NONE SEEN
Trich, Wet Prep: NONE SEEN
Yeast Wet Prep HPF POC: NONE SEEN

## 2018-11-05 LAB — ABO/RH: ABO/RH(D): B POS

## 2018-11-05 LAB — HCG, QUANTITATIVE, PREGNANCY: hCG, Beta Chain, Quant, S: 68173 m[IU]/mL — ABNORMAL HIGH (ref <5)

## 2018-11-05 MED ORDER — METRONIDAZOLE 0.75 % VA GEL: 1.0000 | Freq: Every day | VAGINAL | 0 refills | Status: DC

## 2018-11-05 MED ORDER — PREPLUS 27-1 MG PO TABS: 1.0000 | ORAL_TABLET | Freq: Once | ORAL | 3 refills | Status: AC

## 2018-11-05 NOTE — [facility]U Provider Note (Signed)
History     CSN: [id number]  Arrival date and time: 11/04/18 1843   First Provider Initiated Contact with Patient 11/05/18 [phone]      Chief Complaint  Patient presents with  . Abdominal Cramping  . Possible Pregnancy    Julia Hardy is a 23 y.o. G2P0010 at [redacted]w[redacted]d by LMP.  She presents today for Abdominal Cramping and Possible Pregnancy.  She states she started having some bleeding early yesterday around 7 am that was brown.  She states she has also been having some intermittent cramping, but did not take anything for the cramping.   Patient rates the pain a 6/10, when it occurs, and notes that she experiences the pain mostly with sleeping.  However, she states that it does wake her from sleep. She reports sexual activity in the last 3 days, but denies pain or discomfort with intercourse.  She reports having a positive pregnancy test about 2-3 weeks ago.       OB History    Gravida  2   Para      Term      Preterm      AB  1   Living        SAB      TAB  1   Ectopic      Multiple      Live Births              Past Medical History:  Diagnosis Date  . Medical history non-contributory     Past Surgical History:  Procedure Laterality Date  . WISDOM TOOTH EXTRACTION      Family History  Problem Relation Age of Onset  . Diabetes Mother   . Dementia Paternal Grandfather     Social History   Tobacco Use  . Smoking status: Never Smoker  . Smokeless tobacco: Never Used  Substance Use Topics  . Alcohol use: Not Currently  . Drug use: No    Allergies: No Known Allergies  Medications Prior to Admission  Medication Sig Dispense Refill Last Dose  . loratadine (CLARITIN) 10 MG tablet Take 1 tablet (10 mg total) by mouth daily as needed (watery itchy eyes). 30 tablet 1   . nitrofurantoin, macrocrystal-monohydrate, (MACROBID) 100 MG capsule Take 1 capsule (100 mg total) by mouth 2 (two) times daily. 10 capsule 0     Review of Systems  Constitutional:  Negative for chills and fever.  Respiratory: Negative for cough and shortness of breath.   Gastrointestinal: Positive for abdominal pain (Cramping), nausea and vomiting. Negative for constipation and diarrhea.  Genitourinary: Positive for vaginal bleeding. Negative for difficulty urinating, dysuria, pelvic pain and vaginal discharge.  Neurological: Positive for light-headedness (With vomiting). Negative for dizziness and headaches.   Physical Exam   Blood pressure 129/64, pulse 73, temperature 98.8 F (37.1 C), temperature source Oral, resp. rate 19, weight 75.5 kg, last menstrual period 09/19/2018, SpO2 99 %.  Physical Exam  Constitutional: She is oriented to person, place, and time. She appears well-developed and well-nourished. No distress.  HENT:  Head: Normocephalic and atraumatic.  Eyes: Conjunctivae are normal.  Neck: Normal range of motion.  Cardiovascular: Normal rate, regular rhythm and normal heart sounds.  Respiratory: Effort normal and breath sounds normal.  GI: Soft. Bowel sounds are normal. There is no abdominal tenderness.  Genitourinary: Uterus is enlarged. Cervix exhibits no motion tenderness, no discharge and no friability.    Vaginal discharge present.     No vaginal bleeding.  No  bleeding in the vagina.    Genitourinary Comments: Speculum Exam: -Normal External Genitalia: Non tender, no apparent discharge at introitus.  -Vaginal Vault: Pink mucosa with good rugae. Moderate amt pinkish white discharge -wet prep collected -Cervix:Pink, no lesions, cysts, or polyps.  Appears closed. No active bleeding from os-GC/CT collected -Bimanual Exam:  Closed, No Tenderness Uterus 6-[redacted] week GA    Musculoskeletal: Normal range of motion.  Neurological: She is alert and oriented to person, place, and time.  Skin: Skin is warm and dry.  Psychiatric: She has a normal mood and affect. Her behavior is normal.     MAU Course  Procedures Results for orders placed or performed  during the hospital encounter of 11/04/18 (from the past 24 hour(s))  I-Stat beta hCG blood, ED     Status: Abnormal   Collection Time: 11/04/18  7:42 PM  Result Value Ref Range   I-stat hCG, quantitative >2,000.0 (H) <5 mIU/mL   Comment 3          Urinalysis, Routine w reflex microscopic     Status: Abnormal   Collection Time: 11/04/18 11:35 PM  Result Value Ref Range   Color, Urine YELLOW YELLOW   APPearance CLOUDY (A) CLEAR   Specific Gravity, Urine 1.026 1.005 - 1.030   pH 6.0 5.0 - 8.0   Glucose, UA 50 (A) NEGATIVE mg/dL   Hgb urine dipstick SMALL (A) NEGATIVE   Bilirubin Urine NEGATIVE NEGATIVE   Ketones, ur NEGATIVE NEGATIVE mg/dL   Protein, ur NEGATIVE NEGATIVE mg/dL   Nitrite NEGATIVE NEGATIVE   Leukocytes,Ua NEGATIVE NEGATIVE   RBC / HPF 0-5 0 - 5 RBC/hpf   WBC, UA 6-10 0 - 5 WBC/hpf   Bacteria, UA RARE (A) NONE SEEN   Squamous Epithelial / LPF 11-20 0 - 5   Mucus PRESENT    Amorphous Crystal PRESENT   Wet prep, genital     Status: Abnormal   Collection Time: 11/05/18  2:34 AM  Result Value Ref Range   Yeast Wet Prep HPF POC NONE SEEN NONE SEEN   Trich, Wet Prep NONE SEEN NONE SEEN   Clue Cells Wet Prep HPF POC PRESENT (A) NONE SEEN   WBC, Wet Prep HPF POC FEW (A) NONE SEEN   Sperm NONE SEEN   CBC     Status: None   Collection Time: 11/05/18  2:53 AM  Result Value Ref Range   WBC 7.3 4.0 - 10.5 K/uL   RBC 4.44 3.87 - 5.11 MIL/uL   Hemoglobin 13.5 12.0 - 15.0 g/dL   HCT 74.8 27.0 - 78.6 %   MCV 87.8 80.0 - 100.0 fL   MCH 30.4 26.0 - 34.0 pg   MCHC 34.6 30.0 - 36.0 g/dL   RDW 75.4 49.2 - 01.0 %   Platelets 256 150 - 400 K/uL   nRBC 0.0 0.0 - 0.2 %  hCG, quantitative, pregnancy     Status: Abnormal   Collection Time: 11/05/18  2:53 AM  Result Value Ref Range   hCG, Beta Chain, Quant, S 68,173 (H) <5 mIU/mL  ABO/Rh     Status: None   Collection Time: 11/05/18  2:53 AM  Result Value Ref Range   ABO/RH(D)      B POS Performed at [facility],  [address] [place], [place] [address]     MDM Pelvic Exam; Wet Prep and GC/CT Labs: UA, UPT, CBC, hCG, T&S Ultrasound  Assessment and Plan  23 year old G2P0010 6.5  weeks by LMP Vaginal Bleeding Abdominal Cramping  -Exam findings discussed. -Educated on cramping during pregnancy. -Offered and declines pain medication.  -Cultures collected. -UA with rare bacteria, will send for culture.  -Labs ordered. -Will send for ultrasound.  Cherre RobinsJessica L Andilynn Delavega MSN, CNM 11/05/2018, 2:27 AM   Reassessment 0400 SIUP at 6.3 weeks Bacterial Vaginosis  -Wet prep returns significant for clue cells. -Rx for Metrogel 0.75% PV QHS x 5days sent to pharmacy on file.  -Results discussed with patient. -Informed that dates will not change and EDD is June 26, 2019. -Informed that RX for PNV sent to pharmacy, instructed to initiate. -Reviewed labs results and informed of blood type. -Patient questions if cramping normal during pregnancy and reiterated that cramping can be normal occurrence during pregnancy. -No other questions or concerns. -Encouraged to call or return to MAU if symptoms worsen or with the onset of new symptoms. -Discharged to home in stable condition.  Cherre RobinsJessica L Kessie Croston MSN, CNM  *Nurse comes and reports that patient questions safety of boric acid.  Instructed to discontinue usage.

## 2018-11-05 NOTE — Discharge Instructions (Signed)
First Trimester of Pregnancy The first trimester of pregnancy is from week 1 until the end of week 13 (months 1 through 3). A week after a sperm fertilizes an egg, the egg will implant on the wall of the uterus. This embryo will begin to develop into a baby. Genes from you and your partner will form the baby. The female genes will determine whether the baby will be a boy or a girl. At 6-8 weeks, the eyes and face will be formed, and the heartbeat can be seen on ultrasound. At the end of 12 weeks, all the baby's organs will be formed. Now that you are pregnant, you will want to do everything you can to have a healthy baby. Two of the most important things are to get good prenatal care and to follow your health care provider's instructions. Prenatal care is all the medical care you receive before the baby's birth. This care will help prevent, find, and treat any problems during the pregnancy and childbirth. Body changes during your first trimester Your body goes through many changes during pregnancy. The changes vary from woman to woman.  You may gain or lose a couple of pounds at first.  You may feel sick to your stomach (nauseous) and you may throw up (vomit). If the vomiting is uncontrollable, call your health care provider.  You may tire easily.  You may develop headaches that can be relieved by medicines. All medicines should be approved by your health care provider.  You may urinate more often. Painful urination may mean you have a bladder infection.  You may develop heartburn as a result of your pregnancy.  You may develop constipation because certain hormones are causing the muscles that push stool through your intestines to slow down.  You may develop hemorrhoids or swollen veins (varicose veins).  Your breasts may begin to grow larger and become tender. Your nipples may stick out more, and the tissue that surrounds them (areola) may become darker.  Your gums may bleed and may be  sensitive to brushing and flossing.  Dark spots or blotches (chloasma, mask of pregnancy) may develop on your face. This will likely fade after the baby is born.  Your menstrual periods will stop.  You may have a loss of appetite.  You may develop cravings for certain kinds of food.  You may have changes in your emotions from day to day, such as being excited to be pregnant or being concerned that something may go wrong with the pregnancy and baby.  You may have more vivid and strange dreams.  You may have changes in your hair. These can include thickening of your hair, rapid growth, and changes in texture. Some women also have hair loss during or after pregnancy, or hair that feels dry or thin. Your hair will most likely return to normal after your baby is born. What to expect at prenatal visits During a routine prenatal visit:  You will be weighed to make sure you and the baby are growing normally.  Your blood pressure will be taken.  Your abdomen will be measured to track your baby's growth.  The fetal heartbeat will be listened to between weeks 10 and 14 of your pregnancy.  Test results from any previous visits will be discussed. Your health care provider may ask you:  How you are feeling.  If you are feeling the baby move.  If you have had any abnormal symptoms, such as leaking fluid, bleeding, severe headaches, or abdominal   cramping.  If you are using any tobacco products, including cigarettes, chewing tobacco, and electronic cigarettes.  If you have any questions. Other tests that may be performed during your first trimester include:  Blood tests to find your blood type and to check for the presence of any previous infections. The tests will also be used to check for low iron levels (anemia) and protein on red blood cells (Rh antibodies). Depending on your risk factors, or if you previously had diabetes during pregnancy, you may have tests to check for high blood sugar  that affects pregnant women (gestational diabetes).  Urine tests to check for infections, diabetes, or protein in the urine.  An ultrasound to confirm the proper growth and development of the baby.  Fetal screens for spinal cord problems (spina bifida) and Down syndrome.  HIV (human immunodeficiency virus) testing. Routine prenatal testing includes screening for HIV, unless you choose not to have this test.  You may need other tests to make sure you and the baby are doing well. Follow these instructions at home: Medicines  Follow your health care provider's instructions regarding medicine use. Specific medicines may be either safe or unsafe to take during pregnancy.  Take a prenatal vitamin that contains at least 600 micrograms (mcg) of folic acid.  If you develop constipation, try taking a stool softener if your health care provider approves. Eating and drinking   Eat a balanced diet that includes fresh fruits and vegetables, whole grains, good sources of protein such as meat, eggs, or tofu, and low-fat dairy. Your health care provider will help you determine the amount of weight gain that is right for you.  Avoid raw meat and uncooked cheese. These carry germs that can cause birth defects in the baby.  Eating four or five small meals rather than three large meals a day may help relieve nausea and vomiting. If you start to feel nauseous, eating a few soda crackers can be helpful. Drinking liquids between meals, instead of during meals, also seems to help ease nausea and vomiting.  Limit foods that are high in fat and processed sugars, such as fried and sweet foods.  To prevent constipation: ? Eat foods that are high in fiber, such as fresh fruits and vegetables, whole grains, and beans. ? Drink enough fluid to keep your urine clear or pale yellow. Activity  Exercise only as directed by your health care provider. Most women can continue their usual exercise routine during  pregnancy. Try to exercise for 30 minutes at least 5 days a week. Exercising will help you: ? Control your weight. ? Stay in shape. ? Be prepared for labor and delivery.  Experiencing pain or cramping in the lower abdomen or lower back is a good sign that you should stop exercising. Check with your health care provider before continuing with normal exercises.  Try to avoid standing for long periods of time. Move your legs often if you must stand in one place for a long time.  Avoid heavy lifting.  Wear low-heeled shoes and practice good posture.  You may continue to have sex unless your health care provider tells you not to. Relieving pain and discomfort  Wear a good support bra to relieve breast tenderness.  Take warm sitz baths to soothe any pain or discomfort caused by hemorrhoids. Use hemorrhoid cream if your health care provider approves.  Rest with your legs elevated if you have leg cramps or low back pain.  If you develop varicose veins in   your legs, wear support hose. Elevate your feet for 15 minutes, 3-4 times a day. Limit salt in your diet. Prenatal care  Schedule your prenatal visits by the twelfth week of pregnancy. They are usually scheduled monthly at first, then more often in the last 2 months before delivery.  Write down your questions. Take them to your prenatal visits.  Keep all your prenatal visits as told by your health care provider. This is important. Safety  Wear your seat belt at all times when driving.  Make a list of emergency phone numbers, including numbers for family, friends, the hospital, and police and fire departments. General instructions  Ask your health care provider for a referral to a local prenatal education class. Begin classes no later than the beginning of month 6 of your pregnancy.  Ask for help if you have counseling or nutritional needs during pregnancy. Your health care provider can offer advice or refer you to specialists for help  with various needs.  Do not use hot tubs, steam rooms, or saunas.  Do not douche or use tampons or scented sanitary pads.  Do not cross your legs for long periods of time.  Avoid cat litter boxes and soil used by cats. These carry germs that can cause birth defects in the baby and possibly loss of the fetus by miscarriage or stillbirth.  Avoid all smoking, herbs, alcohol, and medicines not prescribed by your health care provider. Chemicals in these products affect the formation and growth of the baby.  Do not use any products that contain nicotine or tobacco, such as cigarettes and e-cigarettes. If you need help quitting, ask your health care provider. You may receive counseling support and other resources to help you quit.  Schedule a dentist appointment. At home, brush your teeth with a soft toothbrush and be gentle when you floss. Contact a health care provider if:  You have dizziness.  You have mild pelvic cramps, pelvic pressure, or nagging pain in the abdominal area.  You have persistent nausea, vomiting, or diarrhea.  You have a bad smelling vaginal discharge.  You have pain when you urinate.  You notice increased swelling in your face, hands, legs, or ankles.  You are exposed to fifth disease or chickenpox.  You are exposed to German measles (rubella) and have never had it. Get help right away if:  You have a fever.  You are leaking fluid from your vagina.  You have spotting or bleeding from your vagina.  You have severe abdominal cramping or pain.  You have rapid weight gain or loss.  You vomit blood or material that looks like coffee grounds.  You develop a severe headache.  You have shortness of breath.  You have any kind of trauma, such as from a fall or a car accident. Summary  The first trimester of pregnancy is from week 1 until the end of week 13 (months 1 through 3).  Your body goes through many changes during pregnancy. The changes vary from  woman to woman.  You will have routine prenatal visits. During those visits, your health care provider will examine you, discuss any test results you may have, and talk with you about how you are feeling. This information is not intended to replace advice given to you by your health care provider. Make sure you discuss any questions you have with your health care provider. Document Released: 02/21/2001 Document Revised: 02/09/2017 Document Reviewed: 02/09/2016 Elsevier Patient Education  2020 [employer]  

## 2018-11-06 LAB — GC/CHLAMYDIA PROBE AMP (~~LOC~~) NOT AT ARMC
Chlamydia: NEGATIVE
Neisseria Gonorrhea: NEGATIVE

## 2019-03-31 ENCOUNTER — Other Ambulatory Visit: Payer: Self-pay

## 2019-03-31 ENCOUNTER — Encounter (HOSPITAL_COMMUNITY): Payer: Self-pay | Admitting: Emergency Medicine

## 2019-03-31 ENCOUNTER — Ambulatory Visit (HOSPITAL_COMMUNITY)
Admission: EM | Admit: 2019-03-31 | Discharge: 2019-03-31 | Disposition: A | Discharge status: Home / Self Care | Payer: Medicaid Other | Attending: Family Medicine | Admitting: Family Medicine

## 2019-03-31 ENCOUNTER — Ambulatory Visit (INDEPENDENT_AMBULATORY_CARE_PROVIDER_SITE_OTHER): Payer: Medicaid Other

## 2019-03-31 DIAGNOSIS — S86892A Other injury of other muscle(s) and tendon(s) at lower leg level, left leg, initial encounter: Secondary | ICD-10-CM

## 2019-03-31 DIAGNOSIS — M79662 Pain in left lower leg: Secondary | ICD-10-CM

## 2019-03-31 DIAGNOSIS — M79605 Pain in left leg: Secondary | ICD-10-CM

## 2019-03-31 MED ORDER — NAPROXEN 500 MG PO TABS: 500.0000 mg | ORAL_TABLET | Freq: Two times a day (BID) | ORAL | 0 refills | Status: AC

## 2019-03-31 NOTE — Discharge Instructions (Signed)
Rest ice and elevation of leg Naprosyn twice daily with food Please avoid high impact activities such as running and jumping for the next 2 weeks  Follow-up if developing increased pain redness swelling or worsening symptoms

## 2019-03-31 NOTE — ED Triage Notes (Signed)
Left lower leg pain. Believes she may have a stress fracture, works at [employer]. Ankle pain as well.

## 2019-04-02 NOTE — ED Provider Notes (Signed)
[facility]    CSN: [id number] Arrival date & time: 03/31/19  1645      History   Chief Complaint Chief Complaint  Patient presents with  . Leg Pain    HPI Julia Hardy is a 24 y.o. female no significant past medical history presenting today for evaluation of left lower leg pain.  Patient states that over the past couple weeks she has noticed increased pain in her anterior left lower leg.  She notes prior to this she did have a sprained ankle but this resolved.  More recently she has had pain in her lower tibia.  Notices this more with certain movements that involve plantar flexion of foot/ankle.  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  She is often trying to get her route complete and often will run.  She notes increased pain with running.  I recently she has not had any pain and symptoms are manageable with walking, but any increased impact/activity does trigger pain.  She denies prior injury to this leg.  Denies any calf pain or calf swelling.  Denies previous DVT/PE.  She is concerned about a stress fracture in her leg.  HPI  Past Medical History:  Diagnosis Date  . Medical history non-contributory     Patient Active Problem List   Diagnosis Date Noted  . Anal fissure 09/19/2013    Past Surgical History:  Procedure Laterality Date  . WISDOM TOOTH EXTRACTION      OB History    Gravida  2   Para      Term      Preterm      AB  1   Living        SAB      TAB  1   Ectopic      Multiple      Live Births               Home Medications    Prior to Admission medications   Medication Sig Start Date End Date Taking? Authorizing Provider  metroNIDAZOLE (METROGEL VAGINAL) 0.75 % vaginal gel Place 1 Applicatorful vaginally at bedtime. Insert one applicator, at bedtime, for 5 nights. 11/05/18   Gerrit Heck, CNM  naproxen (NAPROSYN) 500 MG tablet Take 1 tablet (500 mg total) by mouth 2 (two) times  daily. 03/31/19   Kyros Salzwedel, Junius Creamer, PA-C    Family History Family History  Problem Relation Age of Onset  . Diabetes Mother   . Dementia Paternal Grandfather     Social History Social History   Tobacco Use  . Smoking status: Never Smoker  . Smokeless tobacco: Never Used  Substance Use Topics  . Alcohol use: Not Currently  . Drug use: No     Allergies   Patient has no known allergies.   Review of Systems Review of Systems  Constitutional: Negative for fatigue and fever.  Eyes: Negative for visual disturbance.  Respiratory: Negative for shortness of breath.   Cardiovascular: Negative for chest pain.  Gastrointestinal: Negative for abdominal pain, nausea and vomiting.  Musculoskeletal: Positive for arthralgias, gait problem and myalgias. Negative for joint swelling.  Skin: Negative for color change, rash and wound.  Neurological: Negative for dizziness, weakness, light-headedness and headaches.     Physical Exam Triage Vital Signs ED Triage Vitals  Enc Vitals Group     BP 03/31/19 1726 125/83     Pulse Rate 03/31/19 1726 70  Resp 03/31/19 1726 16     Temp 03/31/19 1726 98.3 F (36.8 C)     Temp Source 03/31/19 1726 Oral     SpO2 03/31/19 1726 100 %     Weight --      Height --      Head Circumference --      Peak Flow --      Pain Score 03/31/19 1724 0     Pain Loc --      Pain Edu? --      Excl. in GC? --    No data found.  Updated Vital Signs BP 125/83   Pulse 70   Temp 98.3 F (36.8 C) (Oral)   Resp 16   LMP 03/13/2018   SpO2 100%   Breastfeeding Unknown   Visual Acuity Right Eye Distance:   Left Eye Distance:   Bilateral Distance:    Right Eye Near:   Left Eye Near:    Bilateral Near:     Physical Exam Vitals and nursing note reviewed.  Constitutional:      Appearance: She is well-developed.     Comments: No acute distress  HENT:     Head: Normocephalic and atraumatic.     Nose: Nose normal.  Eyes:     Conjunctiva/sclera:  Conjunctivae normal.  Cardiovascular:     Rate and Rhythm: Normal rate.  Pulmonary:     Effort: Pulmonary effort is normal. No respiratory distress.  Abdominal:     General: There is no distension.  Musculoskeletal:        General: Normal range of motion.     Cervical back: Neck supple.     Comments: Left lower leg: No obvious deformity swelling or discoloration, nontender to palpation along patella and medial lateral joint lines, nontender to palpation of fibular insertion at knee, tenderness to palpation of distal anterior tibia and superior to lateral malleolus, nontender throughout dorsum of foot, nontender to calf, knee full active range of motion at ankle and knee  Skin:    General: Skin is warm and dry.  Neurological:     Mental Status: She is alert and oriented to person, place, and time.      UC Treatments / Results  Labs (all labs ordered are listed, but only abnormal results are displayed) Labs Reviewed - No data to display  EKG   Radiology DG Tibia/Fibula Left  Result Date: 03/31/2019 CLINICAL DATA:  Fall with pain EXAM: LEFT TIBIA AND FIBULA - 2 VIEW COMPARISON:  None. FINDINGS: There is no evidence of fracture or other focal bone lesions. Soft tissues are unremarkable. IMPRESSION: Negative. Electronically Signed   By: Jasmine Pang M.D.   On: 03/31/2019 18:22    Procedures Procedures (including critical care time)  Medications Ordered in UC Medications - No data to display  Initial Impression / Assessment and Plan / UC Course  I have reviewed the triage vital signs and the nursing notes.  Pertinent labs & imaging results that were available during my care of the patient were reviewed by me and considered in my medical decision making (see chart for details).     X-ray negative for acute bony abnormality, no sign of stress fracture at this time, but did discuss with patient this is more likely seen on MRIs versus x-rays.  Given location of pain and only with  certain movements/higher impact activities, most likely shin splints vs tendonitis.  Recommended rest, ice, elevation when off of work, avoiding running and high-impact  activities for the next couple of weeks.  Anti-inflammatories around-the-clock, continue to monitor for gradual improvement. Discussed strict return precautions. Patient verbalized understanding and is agreeable with plan.  Final Clinical Impressions(s) / UC Diagnoses   Final diagnoses:  Left leg pain  Left medial tibial stress syndrome, initial encounter     Discharge Instructions     Rest ice and elevation of leg Naprosyn twice daily with food Please avoid high impact activities such as running and jumping for the next 2 weeks  Follow-up if developing increased pain redness swelling or worsening symptoms   ED Prescriptions    Medication Sig Dispense Auth. Provider   naproxen (NAPROSYN) 500 MG tablet Take 1 tablet (500 mg total) by mouth 2 (two) times daily. 30 tablet Rane Dumm, [place] C, PA-C     PDMP not reviewed this encounter.   Janith Lima, PA-C 04/02/19 1013

## 2020-08-13 IMAGING — US TRANSVAGINAL OB ULTRASOUND
1 series · 15 of 24 positions shown · non-contrast
Comparison: None.

CLINICAL DATA: Abdominal pain and vaginal bleeding

EXAM:
TRANSVAGINAL OB ULTRASOUND
TECHNIQUE: Transvaginal ultrasound was performed for complete evaluation of the
gestation as well as the maternal uterus, adnexal regions, and
pelvic cul-de-sac.

[Series 1: transvaginal ob ultrasound · 24 acquisitions, 15 frames shown]
[im 1/24]
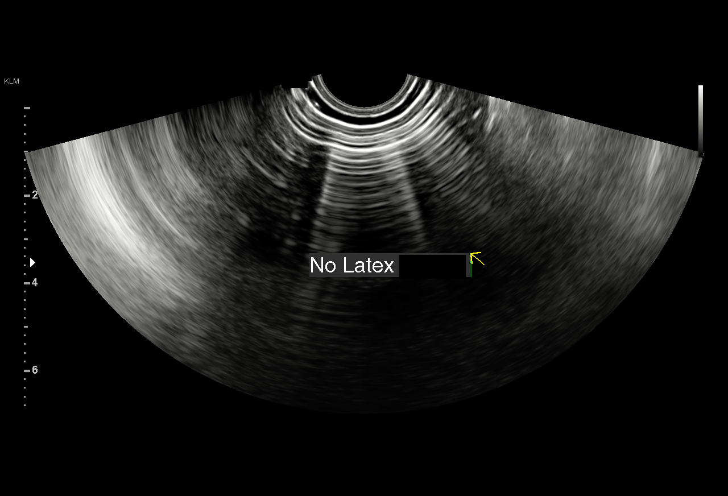
[im 3/24]
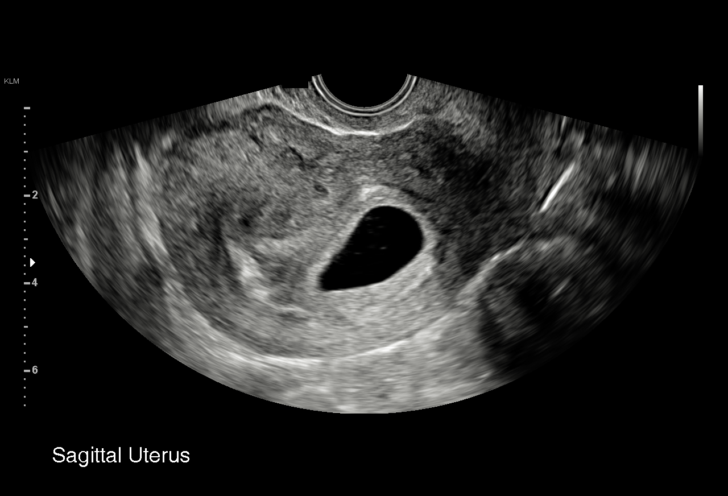
[im 5/24]
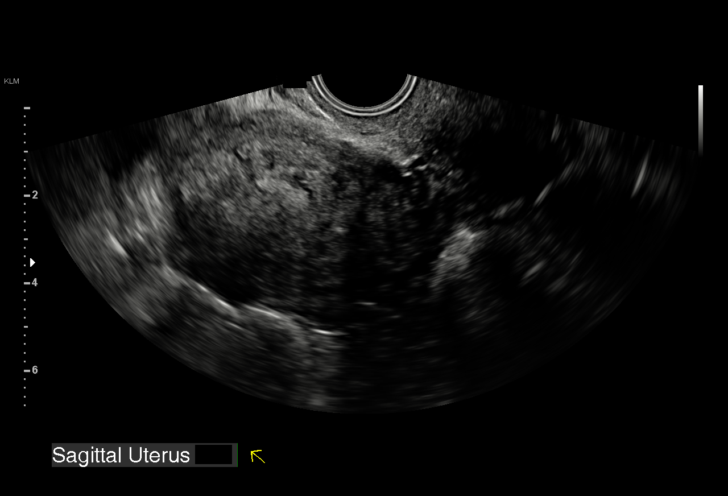
[im 6/24]
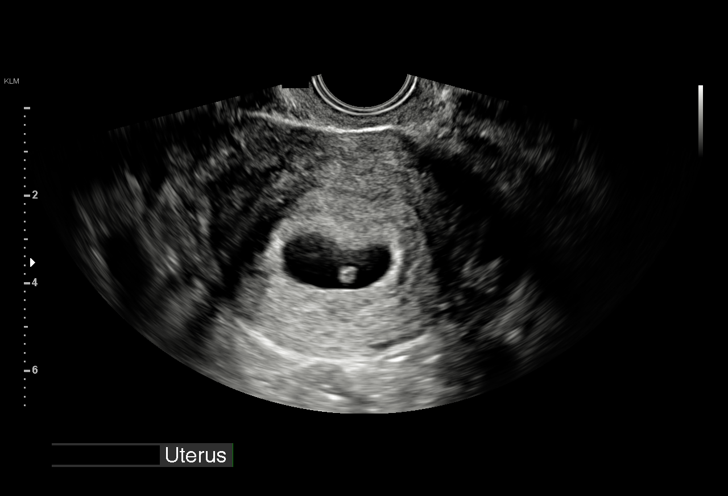
[im 8/24]
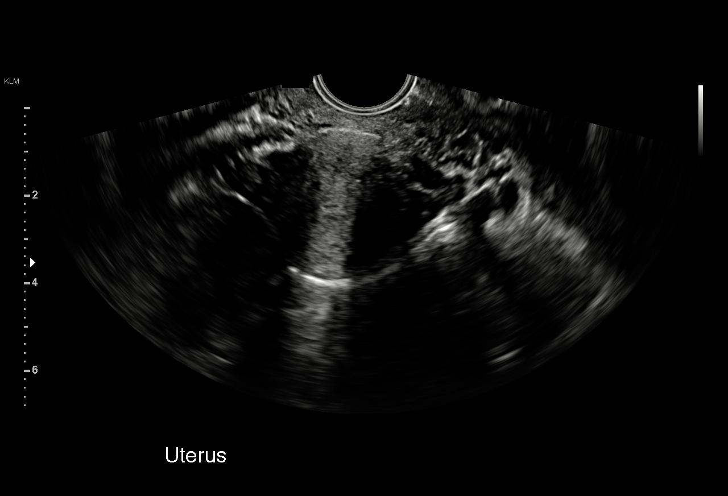
[im 9/24]
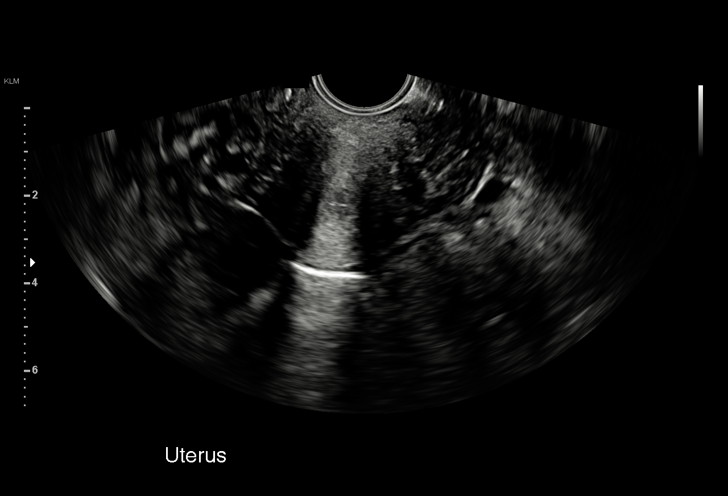
[im 11/24]
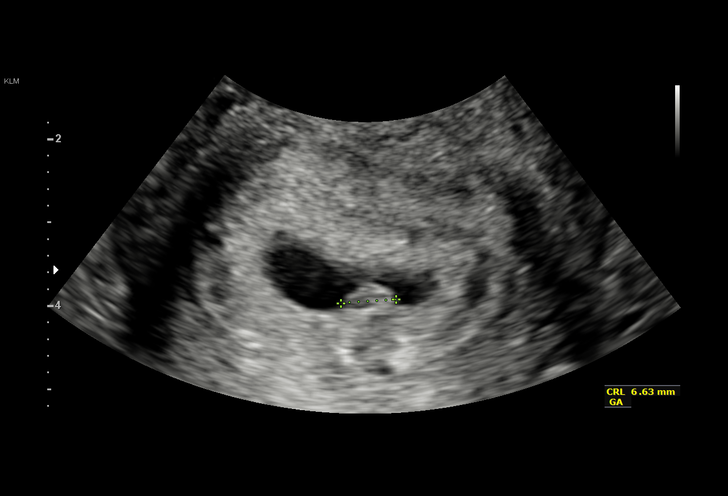
[im 13/24]
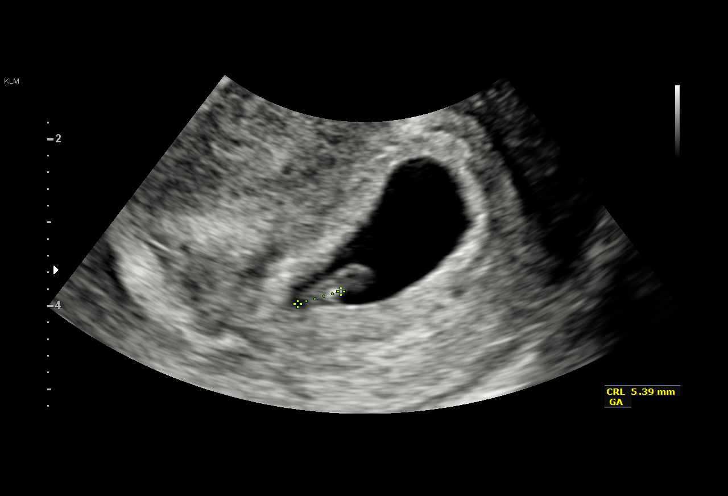
[im 14/24]
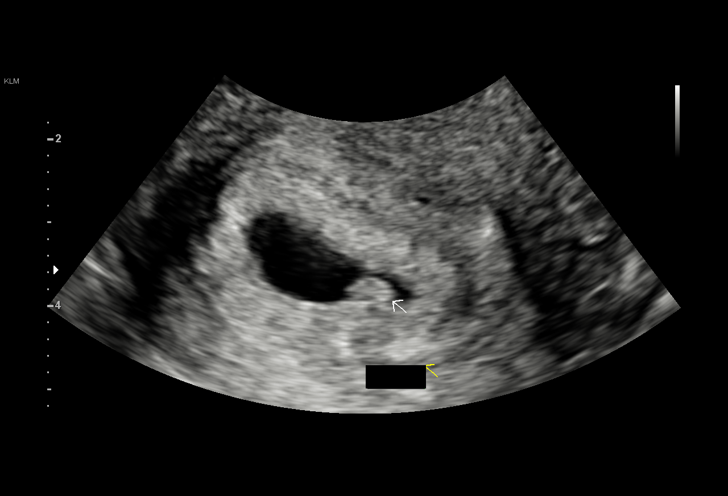
[im 16/24]
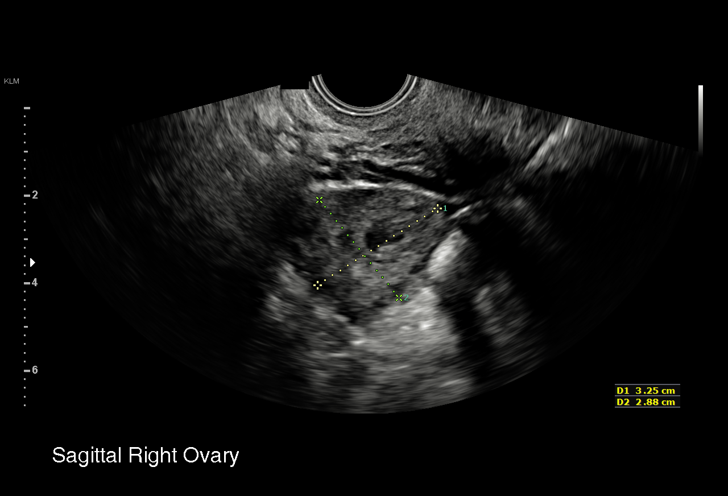
[im 17/24]
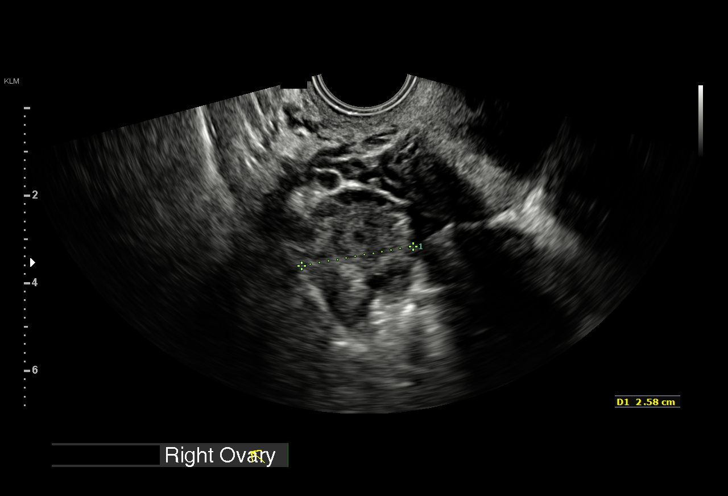
[im 19/24]
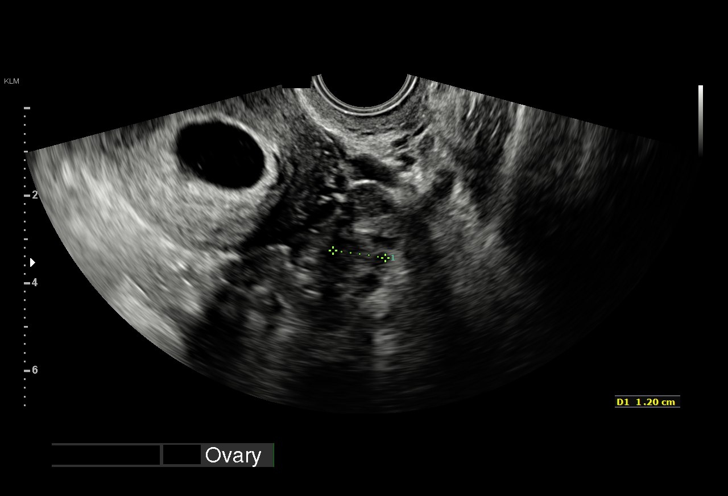
[im 21/24]
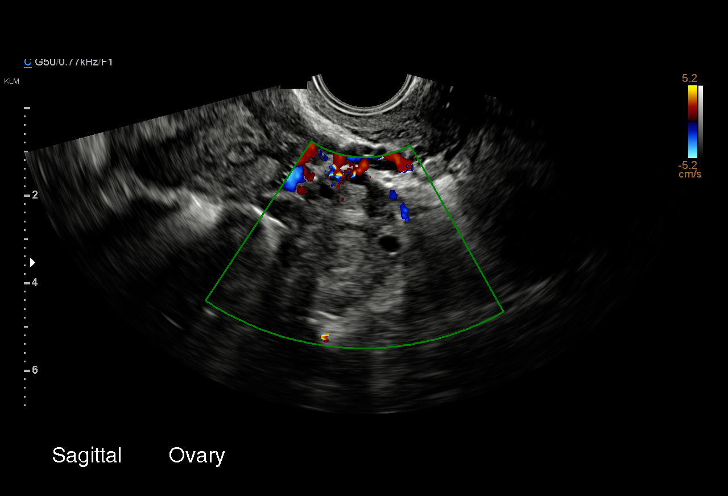
[im 22/24]
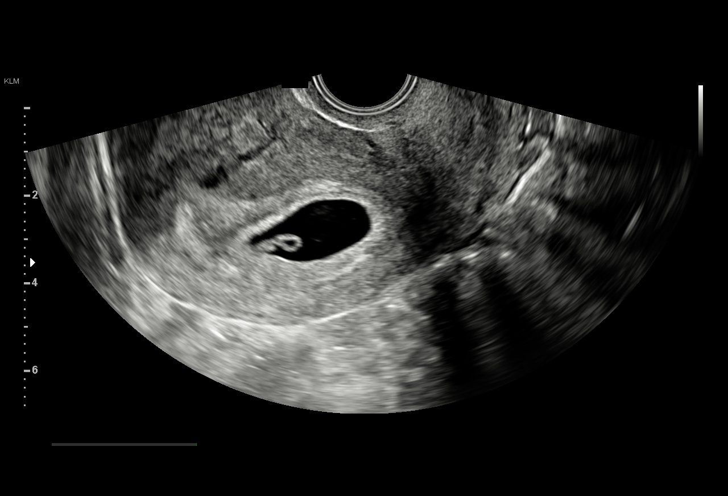
[im 24/24]
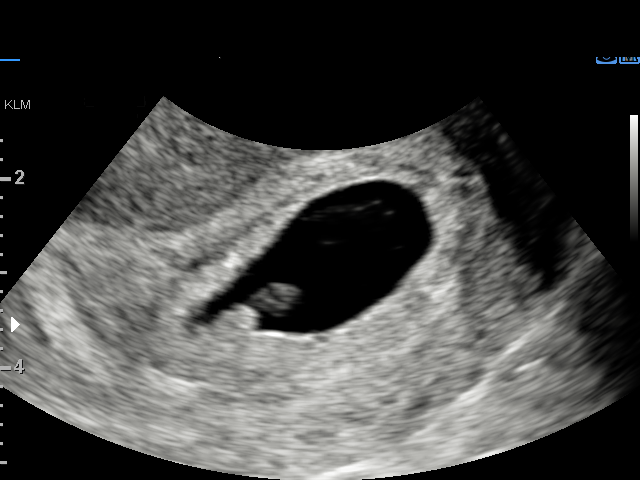

[15 of 24 positions shown; findings below may reference images not displayed]

FINDINGS: Intrauterine gestational sac: Single

Yolk sac:  Visualized

Embryo:  Visualized

Cardiac Activity: Visualized

Heart Rate: 119 bpm

CRL: 6.17 mm   6 w 3 d                  US EDC: 06/28/2019

Subchorionic hemorrhage:  None visualized.

Maternal uterus/adnexae: Ovaries are within normal limits. The right
ovary measures 3.3 x 2.9 x 2.6 cm. The left ovary measures 3.3 x
x 1.2 cm. No significant free fluid
IMPRESSION: Single viable intrauterine pregnancy as above. No specific
abnormality is seen.

## 2021-01-06 IMAGING — DX DG TIBIA/FIBULA 2V*L*
3 series · 3 of 3 positions shown · non-contrast
Comparison: None.

CLINICAL DATA: Fall with pain

EXAM:
LEFT TIBIA AND FIBULA - 2 VIEW

[tibia ap]
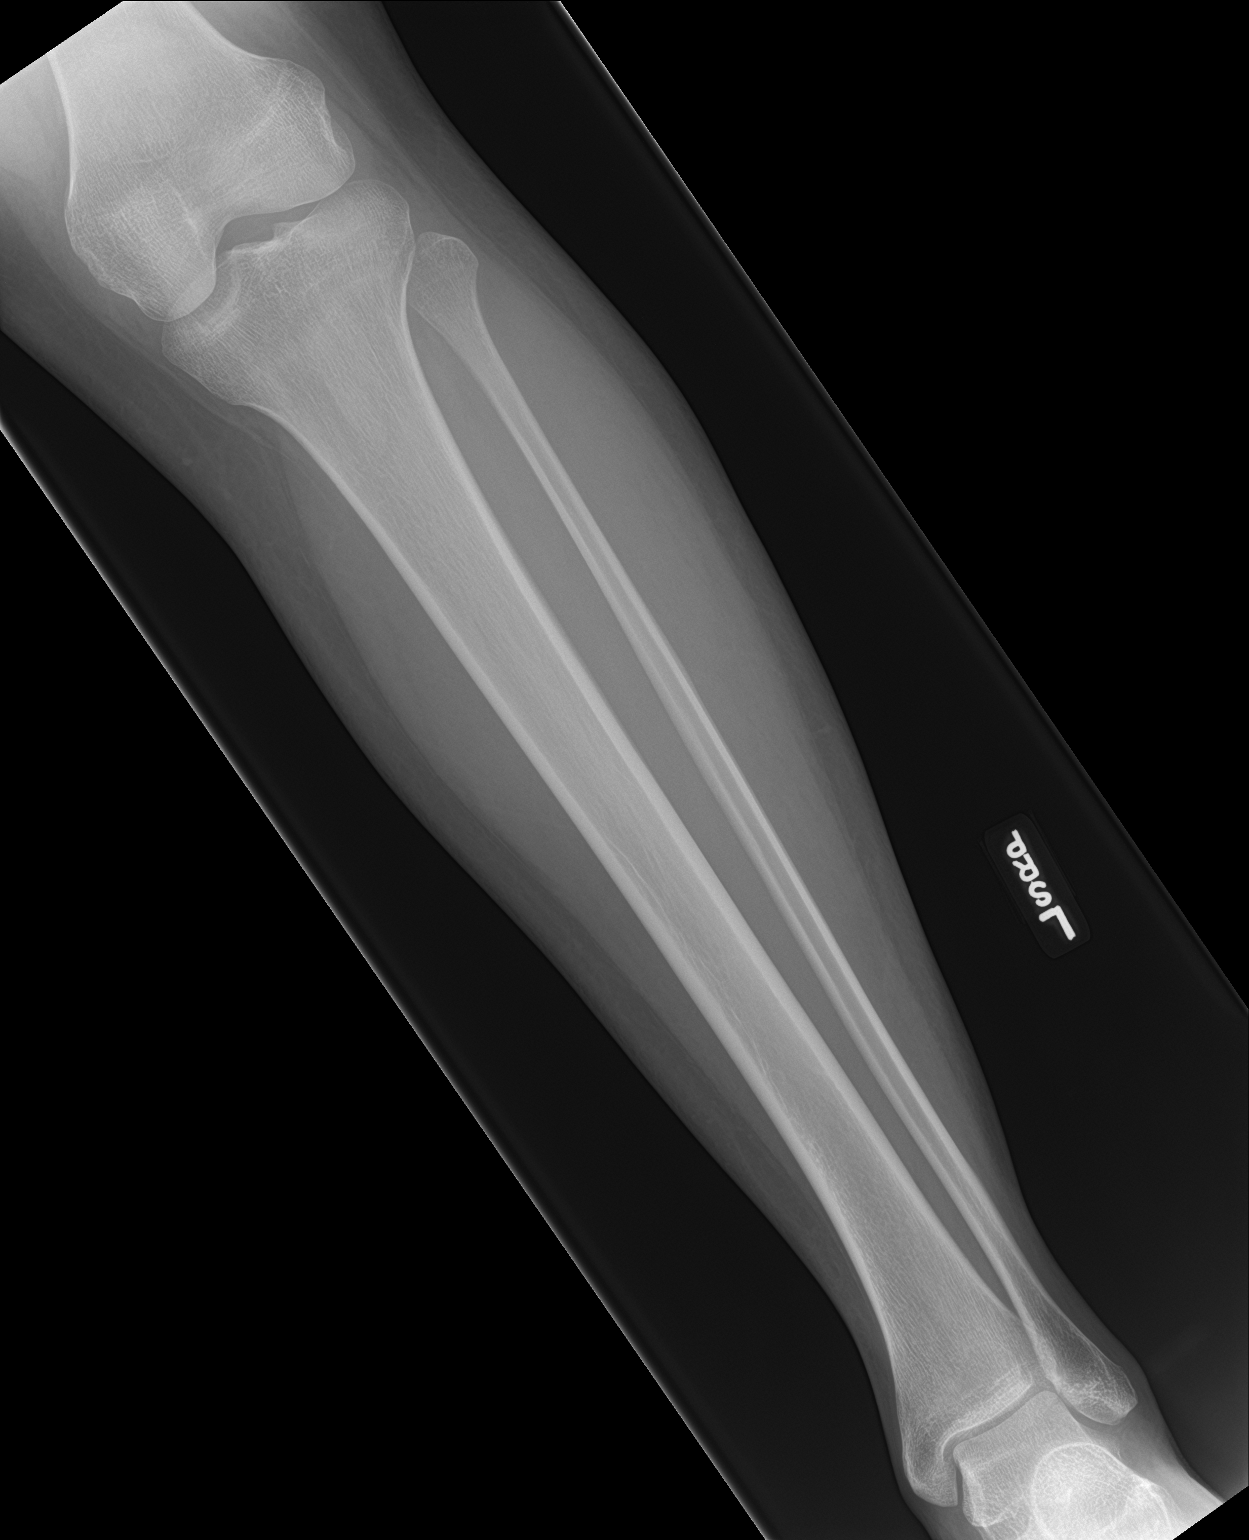

[tibia lat (1 of 2)]
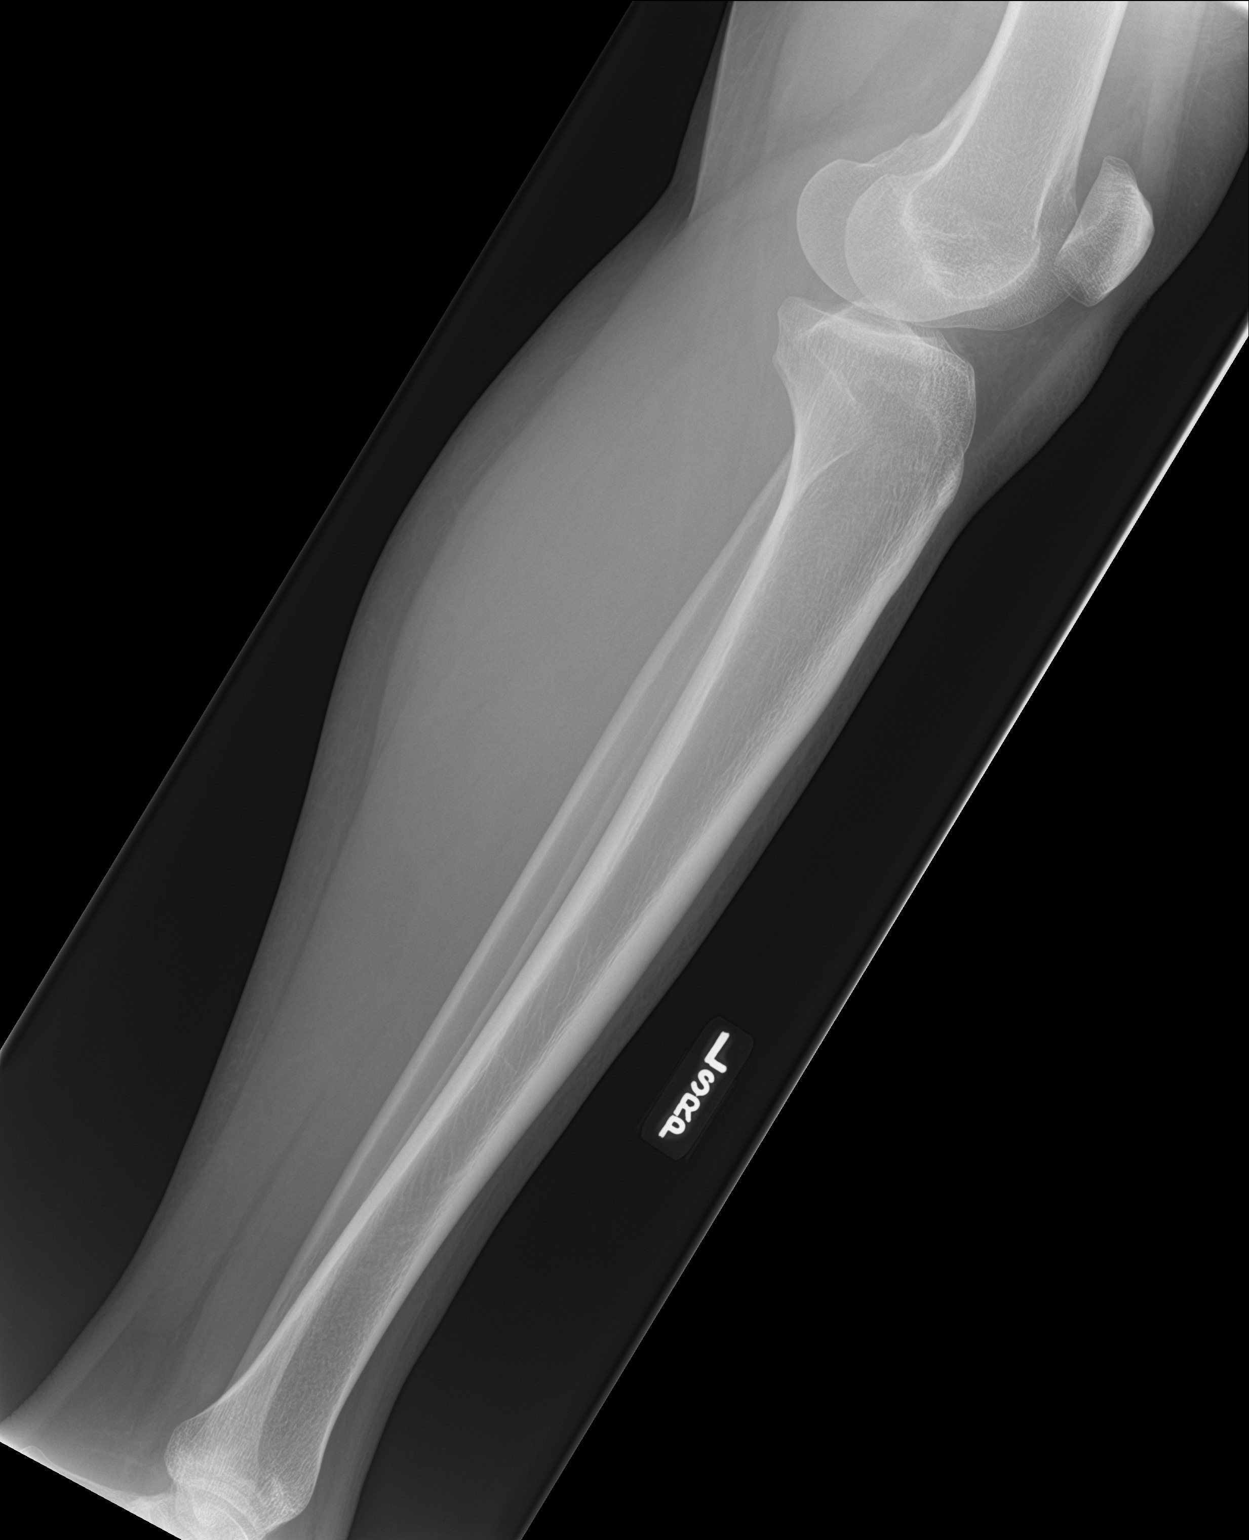

[tibia lat (2 of 2)]
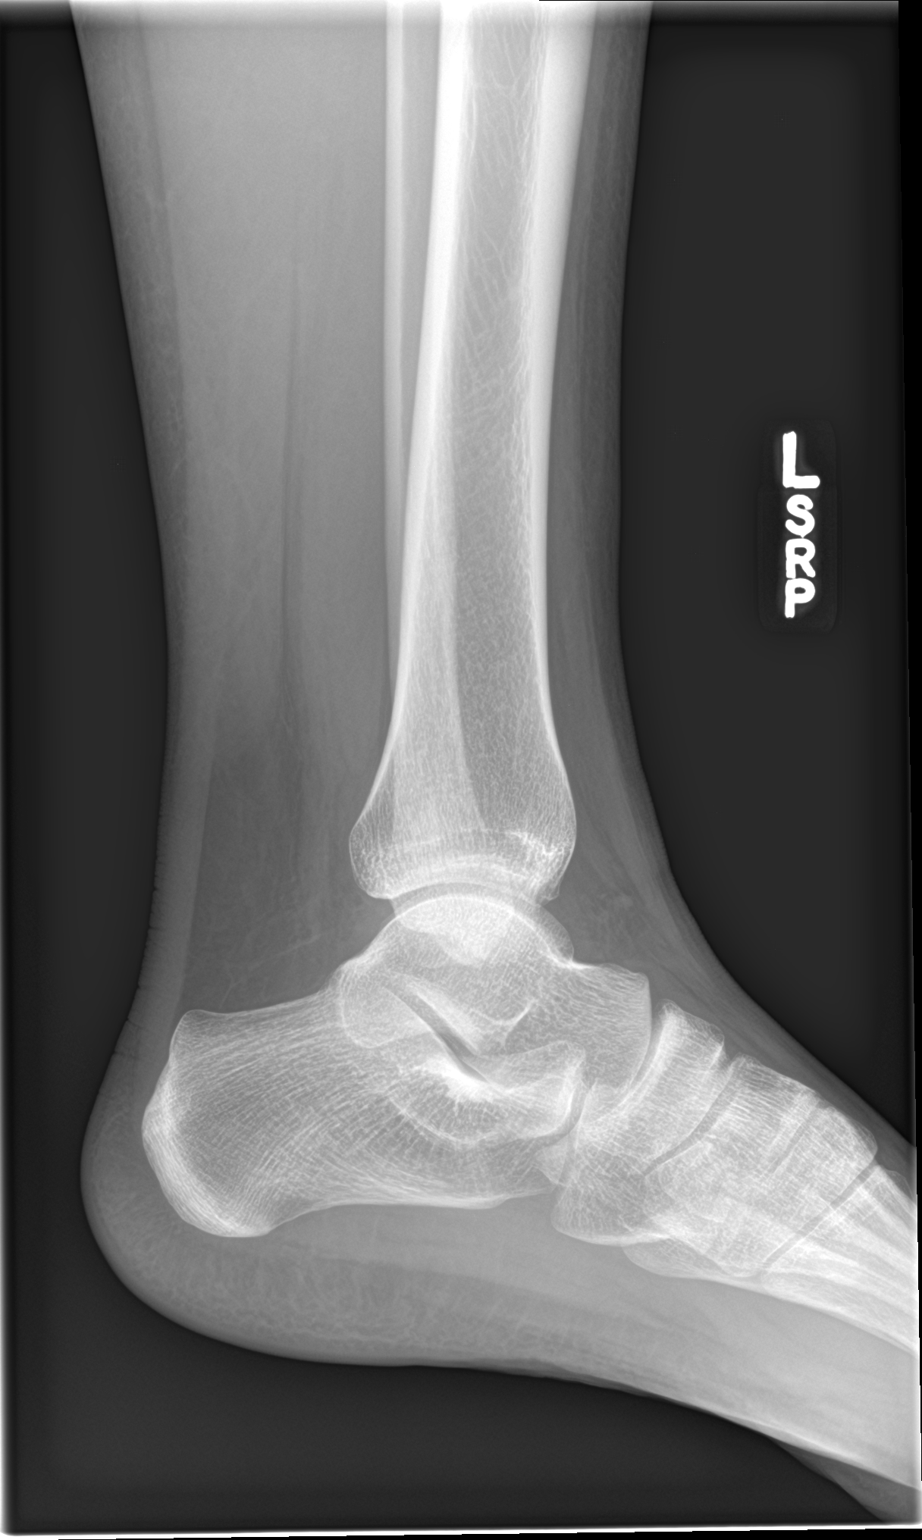

[3 of 3 positions shown; findings below may reference images not displayed]

FINDINGS: There is no evidence of fracture or other focal bone lesions. Soft
tissues are unremarkable.
IMPRESSION: Negative.

## 2021-02-23 ENCOUNTER — Ambulatory Visit
Admission: EM | Admit: 2021-02-23 | Discharge: 2021-02-23 | Disposition: A | Discharge status: Home / Self Care | Payer: Medicaid Other | Attending: Emergency Medicine | Admitting: Emergency Medicine

## 2021-02-23 ENCOUNTER — Encounter: Payer: Self-pay | Admitting: Emergency Medicine

## 2021-02-23 ENCOUNTER — Other Ambulatory Visit: Payer: Self-pay

## 2021-02-23 ENCOUNTER — Ambulatory Visit (HOSPITAL_COMMUNITY): Payer: Self-pay

## 2021-02-23 DIAGNOSIS — N898 Other specified noninflammatory disorders of vagina: Secondary | ICD-10-CM | POA: Insufficient documentation

## 2021-02-23 DIAGNOSIS — Z113 Encounter for screening for infections with a predominantly sexual mode of transmission: Secondary | ICD-10-CM

## 2021-02-23 DIAGNOSIS — Z202 Contact with and (suspected) exposure to infections with a predominantly sexual mode of transmission: Secondary | ICD-10-CM

## 2021-02-23 LAB — POCT URINALYSIS DIP (MANUAL ENTRY)
Bilirubin, UA: NEGATIVE
Blood, UA: NEGATIVE
Glucose, UA: NEGATIVE mg/dL
Ketones, POC UA: NEGATIVE mg/dL
Leukocytes, UA: NEGATIVE
Nitrite, UA: NEGATIVE
Protein Ur, POC: NEGATIVE mg/dL
Spec Grav, UA: 1.02 (ref 1.010–1.025)
Urobilinogen, UA: 0.2 E.U./dL
pH, UA: 6 (ref 5.0–8.0)

## 2021-02-23 LAB — POCT URINE PREGNANCY: Preg Test, Ur: NEGATIVE

## 2021-02-23 MED ORDER — DOXYCYCLINE HYCLATE 100 MG PO CAPS: 100.0000 mg | ORAL_CAPSULE | Freq: Two times a day (BID) | ORAL | 0 refills | Status: AC

## 2021-02-23 MED ORDER — METRONIDAZOLE 0.75 % VA GEL: 1.0000 | Freq: Every day | VAGINAL | 0 refills | Status: AC

## 2021-02-23 MED ORDER — CEFTRIAXONE SODIUM 500 MG IJ SOLR
500.0000 mg | Freq: Once | INTRAMUSCULAR | Status: AC
  Administered 2021-02-23: 13:00:00 500 mg via INTRAMUSCULAR

## 2021-02-23 NOTE — ED Triage Notes (Signed)
Patient presents to [facility] for evaluation after she was notified her sexual partner is positive for Gonorrhea.  Denies any symptoms at this time, however she is on her cycle.  She states for a day or two post coitus she had vaginal odor with very light discharge.  States she would like to be treated today.

## 2021-02-23 NOTE — Discharge Instructions (Addendum)
For your known exposure to gonorrhea, we treated you with an injection of an antibiotic called ceftriaxone.  This is the only treatment you need for gonorrhea.  Because we know that statistically people who are infected with gonorrhea often also have chlamydia, it is best practice to go ahead and treat you for presumed chlamydia with doxycycline.  Please take 1 tablet daily for the next 7 days.  Doxycycline also has the added benefit of clearing up your skin so if your chlamydia test comes back negative, your efforts would not be wasted.  Keep in mind that doxycycline cannot be taken with any dairy products, calcium binds with doxycycline and makes it ineffective to separate your doses of doxycycline from anything that contains dairy by 2 hours.  Because you mentioned that you noticed some vaginal odor, I went ahead and prescribed you a vaginal gel called metronidazole which is used once nightly for 5 nights in a row to treat bacterial vaginosis.  If this is not something that you frequently deal with, as some women do, I recommend that you wait for the results of the vaginal swab test before you pick it up because you may not need it.  As I mentioned, the vaginal swab tests for gonorrhea, chlamydia, trichomonas, bacterial vaginosis and yeast.  The results will initially be posted to your MyChart, if there are positive findings, you will be contacted by phone.  If any further treatment is needed, that will be provided for you as well.  Your pregnancy test today is negative.  We will notify you of the results of your urinalysis once complete.

## 2021-02-23 NOTE — ED Provider Notes (Signed)
[facility]    CSN: [id number] Arrival date & time: 02/23/21  1116    HISTORY   Chief Complaint  Patient presents with   Exposure to STD   HPI Julia Hardy is a 25 y.o. female. Patient presents to [facility] for evaluation after she was notified her sexual partner is positive for Gonorrhea.  Denies burning with urination, genital lesions, urinary frequency, incomplete emptying, at this time, states she is currently menstruating.  She states that for a 2 days post coitus she had abnormal vaginal odor with very light discharge.  States she would like to be treated for gonorrhea today.    The history is provided by the patient.  Past Medical History:  Diagnosis Date   Medical history non-contributory    Patient Active Problem List   Diagnosis Date Noted   Anal fissure 09/19/2013   Past Surgical History:  Procedure Laterality Date   WISDOM TOOTH EXTRACTION     OB History     Gravida  2   Para      Term      Preterm      AB  1   Living         SAB      IAB  1   Ectopic      Multiple      Live Births             Home Medications    Prior to Admission medications   Medication Sig Start Date End Date Taking? Authorizing Provider  naproxen (NAPROSYN) 500 MG tablet Take 1 tablet (500 mg total) by mouth 2 (two) times daily. 03/31/19   Wieters, Junius Creamer, PA-C   Family History Family History  Problem Relation Age of Onset   Diabetes Mother    Dementia Paternal Grandfather    Social History Social History   Tobacco Use   Smoking status: Never   Smokeless tobacco: Never  Substance Use Topics   Alcohol use: Not Currently   Drug use: No   Allergies   Patient has no known allergies.  Review of Systems Review of Systems Pertinent findings noted in history of present illness.   Physical Exam Triage Vital Signs ED Triage Vitals  Enc Vitals Group     BP 01/07/21 0827 (!) 147/82     Pulse Rate 01/07/21 0827 72     Resp 01/07/21 0827 18      Temp 01/07/21 0827 98.3 F (36.8 C)     Temp Source 01/07/21 0827 Oral     SpO2 01/07/21 0827 98 %     Weight --      Height --      Head Circumference --      Peak Flow --      Pain Score 01/07/21 0826 5     Pain Loc --      Pain Edu? --      Excl. in GC? --   No data found.  Updated Vital Signs BP 114/76 (BP Location: Left Arm)    Pulse 72    Temp 98.6 F (37 C) (Oral)    Resp 16    LMP 02/21/2021 (Exact Date)    SpO2 98%   Physical Exam  Visual Acuity Right Eye Distance:   Left Eye Distance:   Bilateral Distance:    Right Eye Near:   Left Eye Near:    Bilateral Near:     UC Couse / Diagnostics / Procedures:  EKG  Radiology No results found.  Procedures Procedures (including critical care time)  UC Diagnoses / Final Clinical Impressions(s)   I have reviewed the triage vital signs and the nursing notes.  Pertinent labs & imaging results that were available during my care of the patient were reviewed by me and considered in my medical decision making (see chart for details).   Final diagnoses:  Screening examination for STD (sexually transmitted disease)  Exposure to gonorrhea  Vaginal discharge  Vaginal odor   Patient was provided with ceftriaxone for gonorrhea and prescription for doxycycline for presumed chlamydia given gonorrhea exposure and abnormal vaginal discharge.  Patient advised that have also provided her with a prescription for metronidazole gel given her complaint of malodorous vaginal discharge, have advised her that she can wait for the results of her STD screening if she likes.  Return precautions advised, all questions addressed.  ED Prescriptions     Medication Sig Dispense Auth. Provider   doxycycline (VIBRAMYCIN) 100 MG capsule Take 1 capsule (100 mg total) by mouth 2 (two) times daily for 7 days. 14 capsule Theadora Rama Scales, PA-C   metroNIDAZOLE (METROGEL) 0.75 % vaginal gel Place 1 Applicatorful vaginally at bedtime for 5 days.  Abstain from sexual intercourse until treatment is complete 70 g Theadora Rama Scales, PA-C      PDMP not reviewed this encounter.  Pending results:  Labs Reviewed  POCT URINE PREGNANCY  POCT URINALYSIS DIP (MANUAL ENTRY)  POCT URINE PREGNANCY  CERVICOVAGINAL ANCILLARY ONLY    Medications Ordered in UC: Medications  cefTRIAXone (ROCEPHIN) injection 500 mg (has no administration in time range)    Disposition Upon Discharge:  Condition: stable for discharge home  Patient presents today with concerns for exposure to sexually transmitted disease, requesting testing.  STD screening was performed as indicated.  Patient has been advised that the results of screening will be made available to them via MyChart and, if there are any positive findings, they will be contacted by phone, recommendations for treatment will be advised and prescriptions will be provided as indicated based on clinical guidelines.  Patient has also been advised that if treatment is recommended, they should abstain from sexual intercourse of all forms until treatment is complete.  Patient has further been advised that once treatment is complete, they have not had a complete resolution of their symptoms, if any, they should continue to abstain from sexual intercourse with all forms and follow-up with her primary care provider or return to urgent care for repeat testing.  As such, the patient has been evaluated and assessed, work-up was performed and treatment was provided in alignment with urgent care protocols and evidence based medicine.  Patient/parent/caregiver has been advised that the patient may require follow up for further testing and/or treatment if the symptoms continue in spite of treatment, as clinically indicated and appropriate.  Routine symptom specific, illness specific and/or disease specific instructions were discussed with the patient and/or caregiver at length.  Prevention strategies for avoiding STD  exposure were also discussed.  The patient will follow up with their current PCP if and as advised. If the patient does not currently have a PCP we will assist them in obtaining one.   The patient may need specialty follow up if the symptoms continue, in spite of conservative treatment and management, for further workup, evaluation, consultation and treatment as clinically indicated and appropriate.  Patient/parent/caregiver verbalized understanding and agreement of plan as discussed.  All questions were addressed during visit.  Please see discharge instructions below for further details of plan.  Discharge Instructions:   Discharge Instructions      For your known exposure to gonorrhea, we treated you with an injection of an antibiotic called ceftriaxone.  This is the only treatment you need for gonorrhea.  Because we know that statistically people who are infected with gonorrhea often also have chlamydia, it is best practice to go ahead and treat you for presumed chlamydia with doxycycline.  Please take 1 tablet daily for the next 7 days.  Doxycycline also has the added benefit of clearing up your skin so if your chlamydia test comes back negative, your efforts would not be wasted.  Keep in mind that doxycycline cannot be taken with any dairy products, calcium binds with doxycycline and makes it ineffective to separate your doses of doxycycline from anything that contains dairy by 2 hours.  Because you mentioned that you noticed some vaginal odor, I went ahead and prescribed you a vaginal gel called metronidazole which is used once nightly for 5 nights in a row to treat bacterial vaginosis.  If this is not something that you frequently deal with, as some women do, I recommend that you wait for the results of the vaginal swab test before you pick it up because you may not need it.  As I mentioned, the vaginal swab tests for gonorrhea, chlamydia, trichomonas, bacterial vaginosis and yeast.  The  results will initially be posted to your MyChart, if there are positive findings, you will be contacted by phone.  If any further treatment is needed, that will be provided for you as well.  Your pregnancy test today is negative.  We will notify you of the results of your urinalysis once complete.         Theadora Rama Scales, PA-C 02/23/21 1304

## 2021-02-24 LAB — CERVICOVAGINAL ANCILLARY ONLY
Bacterial Vaginitis (gardnerella): NEGATIVE
Candida Glabrata: NEGATIVE
Candida Vaginitis: NEGATIVE
Chlamydia: NEGATIVE
Comment: NEGATIVE
Comment: NEGATIVE
Comment: NEGATIVE
Comment: NEGATIVE
Comment: NEGATIVE
Comment: NORMAL
Neisseria Gonorrhea: POSITIVE — AB
Trichomonas: NEGATIVE

## 2021-07-09 ENCOUNTER — Encounter (HOSPITAL_COMMUNITY): Payer: Self-pay

## 2021-07-09 ENCOUNTER — Ambulatory Visit (HOSPITAL_COMMUNITY)
Admission: RE | Admit: 2021-07-09 | Discharge: 2021-07-09 | Disposition: A | Discharge status: Home / Self Care | Payer: Medicaid Other | Source: Ambulatory Visit | Attending: Urgent Care | Admitting: Urgent Care

## 2021-07-09 VITALS — BP 121/87 | HR 68 | Temp 98.4°F | Resp 16 | Ht 65.0 in | Wt 166.4 lb

## 2021-07-09 DIAGNOSIS — Z113 Encounter for screening for infections with a predominantly sexual mode of transmission: Secondary | ICD-10-CM | POA: Insufficient documentation

## 2021-07-09 DIAGNOSIS — N760 Acute vaginitis: Secondary | ICD-10-CM

## 2021-07-09 LAB — POCT URINALYSIS DIPSTICK, ED / UC
Bilirubin Urine: NEGATIVE
Glucose, UA: NEGATIVE mg/dL
Ketones, ur: NEGATIVE mg/dL
Leukocytes,Ua: NEGATIVE
Nitrite: NEGATIVE
Protein, ur: NEGATIVE mg/dL
Specific Gravity, Urine: 1.025 (ref 1.005–1.030)
Urobilinogen, UA: 0.2 mg/dL (ref 0.0–1.0)
pH: 6 (ref 5.0–8.0)

## 2021-07-09 LAB — POC URINE PREG, ED: Preg Test, Ur: NEGATIVE

## 2021-07-09 NOTE — Discharge Instructions (Signed)
You were tested today for gonorrhea, chlamydia, trichomonas, BV, and yeast. ?We will call you with the results of your test once received. ?Please avoid all forms of intercourse until test results received, and if positive for any STI, all partners will need to complete entire course of antibiotics prior to resuming. ?As always, practice safer sexual practices by using protection each and every time, and limiting number of partners.  ?

## 2021-07-09 NOTE — ED Triage Notes (Signed)
Pt presents to UC for STD testing. Denies any current symptoms.  ?

## 2021-07-09 NOTE — ED Provider Notes (Addendum)
?[facility] ? ? ? ?CSN: [id number] ?Arrival date & time: 07/09/21  1031 ? ? ?  ? ?History   ?Chief Complaint ?Chief Complaint  ?Patient presents with  ? SEXUALLY TRANSMITTED DISEASE  ?  Entered by patient  ? ? ?HPI ?Julia Hardy is a 26 y.o. female.  ? ?Pleasant 26 year old female presents today primarily requesting STI testing.  She states for the past 2 weeks she has had an intermittent mild odor, but denies extensive vaginal discharge.  She denies any vaginal itching, pelvic pain, dysuria, rash, lesions.  She denies any known exposure to an STI, but states she wants to be tested.  She has had BV in the past.  She also notes that she recently got off her menstrual period. ? ? ? ?Past Medical History:  ?Diagnosis Date  ? Medical history non-contributory   ? ? ?Patient Active Problem List  ? Diagnosis Date Noted  ? Anal fissure 09/19/2013  ? ? ?Past Surgical History:  ?Procedure Laterality Date  ? WISDOM TOOTH EXTRACTION    ? ? ?OB History   ? ? Gravida  ?2  ? Para  ?   ? Term  ?   ? Preterm  ?   ? AB  ?1  ? Living  ?   ?  ? ? SAB  ?   ? IAB  ?1  ? Ectopic  ?   ? Multiple  ?   ? Live Births  ?   ?   ?  ?  ? ? ? ?Home Medications   ? ?Prior to Admission medications   ?Medication Sig Start Date End Date Taking? Authorizing Provider  ?naproxen (NAPROSYN) 500 MG tablet Take 1 tablet (500 mg total) by mouth 2 (two) times daily. 03/31/19   Wieters, Hallie C, PA-C  ? ? ?Family History ?Family History  ?Problem Relation Age of Onset  ? Diabetes Mother   ? Dementia Paternal Grandfather   ? ? ?Social History ?Social History  ? ?Tobacco Use  ? Smoking status: Never  ? Smokeless tobacco: Never  ?Substance Use Topics  ? Alcohol use: Not Currently  ? Drug use: No  ? ? ? ?Allergies   ?Patient has no known allergies. ? ? ?Review of Systems ?Review of Systems  ?Genitourinary:   ?     Mild vaginal odor  ?All other systems reviewed and are negative. ? ? ?Physical Exam ?Triage Vital Signs ?ED Triage Vitals  ?Enc Vitals Group   ?   BP 07/09/21 1108 121/87  ?   Pulse Rate 07/09/21 1108 68  ?   Resp 07/09/21 1108 16  ?   Temp 07/09/21 1108 98.4 ?F (36.9 ?C)  ?   Temp Source 07/09/21 1108 Oral  ?   SpO2 07/09/21 1108 98 %  ?   Weight 07/09/21 1107 166 lb 7.2 oz (75.5 kg)  ?   Height 07/09/21 1107 5\' 5"  (1.651 m)  ?   Head Circumference --   ?   Peak Flow --   ?   Pain Score 07/09/21 1107 0  ?   Pain Loc --   ?   Pain Edu? --   ?   Excl. in GC? --   ? ?No data found. ? ?Updated Vital Signs ?BP 121/87 (BP Location: Left Arm)   Pulse 68   Temp 98.4 ?F (36.9 ?C) (Oral)   Resp 16   Ht 5\' 5"  (1.651 m)   Wt 166 lb 7.2 oz (75.5  kg)   LMP 07/01/2021 (Exact Date)   SpO2 98%   BMI 27.70 kg/m?  ? ?Visual Acuity ?Right Eye Distance:   ?Left Eye Distance:   ?Bilateral Distance:   ? ?Right Eye Near:   ?Left Eye Near:    ?Bilateral Near:    ? ?Physical Exam ?Vitals and nursing note reviewed.  ?Constitutional:   ?   General: She is not in acute distress. ?   Appearance: Normal appearance. She is normal weight. She is not ill-appearing or toxic-appearing.  ?HENT:  ?   Head: Normocephalic.  ?Cardiovascular:  ?   Rate and Rhythm: Normal rate.  ?Pulmonary:  ?   Effort: Pulmonary effort is normal. No respiratory distress.  ?Abdominal:  ?   General: Abdomen is flat. Bowel sounds are normal. There is no distension.  ?   Palpations: Abdomen is soft. There is no mass.  ?   Tenderness: There is no abdominal tenderness. There is no right CVA tenderness, left CVA tenderness or guarding.  ?Genitourinary: ?   Comments: Exam offered, deferred per pt ?Skin: ?   General: Skin is warm.  ?   Coloration: Skin is not jaundiced.  ?   Findings: No erythema or rash.  ?Neurological:  ?   Mental Status: She is alert.  ?Psychiatric:     ?   Mood and Affect: Mood normal.  ? ? ? ?UC Treatments / Results  ?Labs ?(all labs ordered are listed, but only abnormal results are displayed) ?Labs Reviewed  ?POCT URINALYSIS DIPSTICK, ED / UC - Abnormal; Notable for the following  components:  ?    Result Value  ? Hgb urine dipstick MODERATE (*)   ? All other components within normal limits  ?POC URINE PREG, ED  ?CERVICOVAGINAL ANCILLARY ONLY  ? ? ?EKG ? ? ?Radiology ?No results found. ? ?Procedures ?Procedures (including critical care time) ? ?Medications Ordered in UC ?Medications - No data to display ? ?Initial Impression / Assessment and Plan / UC Course  ?I have reviewed the triage vital signs and the nursing notes. ? ?Pertinent labs & imaging results that were available during my care of the patient were reviewed by me and considered in my medical decision making (see chart for details). ? ?  ? ?Exam for STI screening - Aptima swab collected today. Will start tx based upon results of testing. ?Vaginitis - mild odor per pt. Has hx of BV. No red flag s/sx. Pt already tried boric acid, no Rx sent in today, will await results of testing and tx if indicated. Pt agrees with tx plan. ? ?Final Clinical Impressions(s) / UC Diagnoses  ? ?Final diagnoses:  ?Encounter for screening examination for sexually transmitted disease  ?Vaginitis and vulvovaginitis  ? ? ? ?Discharge Instructions   ? ?  ?You were tested today for gonorrhea, chlamydia, trichomonas, BV, and yeast. ?We will call you with the results of your test once received. ?Please avoid all forms of intercourse until test results received, and if positive for any STI, all partners will need to complete entire course of antibiotics prior to resuming. ?As always, practice safer sexual practices by using protection each and every time, and limiting number of partners.  ? ? ? ?ED Prescriptions   ?None ?  ? ?PDMP not reviewed this encounter. ?  Maretta Bees, [place] ?07/09/21 1127 ? ?  ?Maretta Bees, [place] ?07/09/21 1127 ? ?

## 2021-07-12 ENCOUNTER — Telehealth (HOSPITAL_COMMUNITY): Payer: Self-pay | Admitting: Emergency Medicine

## 2021-07-12 LAB — CERVICOVAGINAL ANCILLARY ONLY
Bacterial Vaginitis (gardnerella): POSITIVE — AB
Candida Glabrata: NEGATIVE
Candida Vaginitis: NEGATIVE
Chlamydia: NEGATIVE
Comment: NEGATIVE
Comment: NEGATIVE
Comment: NEGATIVE
Comment: NEGATIVE
Comment: NEGATIVE
Comment: NORMAL
Neisseria Gonorrhea: NEGATIVE
Trichomonas: NEGATIVE

## 2021-07-12 MED ORDER — METRONIDAZOLE 500 MG PO TABS: 500.0000 mg | ORAL_TABLET | Freq: Two times a day (BID) | ORAL | 0 refills | Status: DC

## 2021-08-21 ENCOUNTER — Ambulatory Visit (HOSPITAL_COMMUNITY)
Admission: EM | Admit: 2021-08-21 | Discharge: 2021-08-21 | Disposition: A | Discharge status: Home / Self Care | Payer: Self-pay | Attending: Internal Medicine | Admitting: Internal Medicine

## 2021-08-21 ENCOUNTER — Encounter (HOSPITAL_COMMUNITY): Payer: Self-pay

## 2021-08-21 DIAGNOSIS — J029 Acute pharyngitis, unspecified: Secondary | ICD-10-CM | POA: Insufficient documentation

## 2021-08-21 DIAGNOSIS — Z113 Encounter for screening for infections with a predominantly sexual mode of transmission: Secondary | ICD-10-CM | POA: Insufficient documentation

## 2021-08-21 NOTE — ED Triage Notes (Signed)
Patient presents to Urgent Care with complaints of sore throat  and cold symptoms since one week ago. Patient reports new onset of vaginal smell. No discharge, no dysuria or frequency. Pt reports boric acid suppositories.

## 2021-08-21 NOTE — Discharge Instructions (Signed)
STI testing is pending. We will call you in the next 2-3 days if your testing is positive. You will not hear from [place] if your testing is negative. Abstain from sexual intercourse until your STI testing comes back to avoid spread of STIs. Use condoms to prevent unwanted pregnancy and to prevent the spread of STIs.   If you develop any new or worsening symptoms or do not improve in the next 2 to 3 days, please return.  If your symptoms are severe, please go to the emergency room.  Follow-up with your primary care provider for further evaluation and management of your symptoms as well as ongoing wellness visits.  I hope you feel better!

## 2021-08-21 NOTE — ED Provider Notes (Signed)
[facility]    CSN: [id number] Arrival date & time: 08/21/21  1615      History   Chief Complaint Chief Complaint  Patient presents with   Sore Throat   SEXUALLY TRANSMITTED DISEASE    HPI Julia Hardy is a 26 y.o. female.   Patient presents to urgent care for evaluation of sore throat and cough that started August 17, 2021, but has improved since then. She currently states that her throat doesn't hurt because she's been using halls cough drops. She has been drinking tea with honey to make her throat better. She denies fever/chills, abdominal pain, nausea, vomiting, and dizziness. Also reporting vaginal odor. Denies vaginal discharge/itching. Denies urinary frequency, urgency, and dysuria. She reports recent new unprotected sexual partner and does not know if they are symptomatic for STIs. Denies known exposure to STI, but wants to get checked today. Denies chance of pregnancy and LMP was Jul 30, 2021.    Sore Throat    Past Medical History:  Diagnosis Date   Medical history non-contributory     Patient Active Problem List   Diagnosis Date Noted   Anal fissure 09/19/2013    Past Surgical History:  Procedure Laterality Date   WISDOM TOOTH EXTRACTION      OB History     Gravida  2   Para      Term      Preterm      AB  1   Living         SAB      IAB  1   Ectopic      Multiple      Live Births               Home Medications    Prior to Admission medications   Medication Sig Start Date End Date Taking? Authorizing Provider  metroNIDAZOLE (FLAGYL) 500 MG tablet Take 1 tablet (500 mg total) by mouth 2 (two) times daily. 08/23/21   Lamptey, Britta MccreedyPhilip O, MD  naproxen (NAPROSYN) 500 MG tablet Take 1 tablet (500 mg total) by mouth 2 (two) times daily. 03/31/19   Wieters, Junius CreamerHallie C, PA-C    Family History Family History  Problem Relation Age of Onset   Diabetes Mother    Dementia Paternal Grandfather     Social History Social History    Tobacco Use   Smoking status: Never   Smokeless tobacco: Never  Substance Use Topics   Alcohol use: Not Currently   Drug use: No     Allergies   Patient has no known allergies.   Review of Systems Review of Systems Per HPI  Physical Exam Triage Vital Signs ED Triage Vitals  Enc Vitals Group     BP 08/21/21 1658 126/71     Pulse Rate 08/21/21 1658 82     Resp 08/21/21 1658 18     Temp 08/21/21 1658 98.2 F (36.8 C)     Temp Source 08/21/21 1658 Oral     SpO2 08/21/21 1658 99 %     Weight --      Height --      Head Circumference --      Peak Flow --      Pain Score 08/21/21 1657 0     Pain Loc --      Pain Edu? --      Excl. in GC? --    No data found.  Updated Vital Signs BP 126/71   Pulse  82   Temp 98.2 F (36.8 C) (Oral)   Resp 18   LMP 07/30/2021 (Approximate)   SpO2 99%   Visual Acuity Right Eye Distance:   Left Eye Distance:   Bilateral Distance:    Right Eye Near:   Left Eye Near:    Bilateral Near:     Physical Exam Vitals and nursing note reviewed.  Constitutional:      General: She is not in acute distress.    Appearance: She is well-developed.  HENT:     Head: Normocephalic and atraumatic.     Right Ear: Tympanic membrane and ear canal normal. No tenderness. Tympanic membrane is not erythematous.     Left Ear: Tympanic membrane and ear canal normal. No tenderness. Tympanic membrane is not erythematous.     Nose: Rhinorrhea present.     Mouth/Throat:     Mouth: Mucous membranes are moist.     Pharynx: No posterior oropharyngeal erythema.     Tonsils: No tonsillar exudate or tonsillar abscesses.  Eyes:     Conjunctiva/sclera: Conjunctivae normal.  Cardiovascular:     Rate and Rhythm: Normal rate and regular rhythm.     Heart sounds: No murmur heard.    No friction rub. No gallop.  Pulmonary:     Effort: Pulmonary effort is normal. No respiratory distress.     Breath sounds: Normal breath sounds.  Abdominal:     General:  Bowel sounds are normal.     Palpations: Abdomen is soft.     Tenderness: There is no abdominal tenderness.  Musculoskeletal:        General: No swelling.     Cervical back: Normal range of motion and neck supple.  Lymphadenopathy:     Cervical: No cervical adenopathy.  Skin:    General: Skin is warm and dry.     Capillary Refill: Capillary refill takes less than 2 seconds.  Neurological:     General: No focal deficit present.     Mental Status: She is alert and oriented to person, place, and time.  Psychiatric:        Mood and Affect: Mood normal.        Behavior: Behavior normal.      UC Treatments / Results  Labs (all labs ordered are listed, but only abnormal results are displayed)   EKG   Radiology No results found.  Procedures Procedures (including critical care time)  Medications Ordered in UC Medications - No data to display  Initial Impression / Assessment and Plan / UC Course  I have reviewed the triage vital signs and the nursing notes.  Pertinent labs & imaging results that were available during my care of the patient were reviewed by me and considered in my medical decision making (see chart for details).  Patient is a 26 year old female presenting to urgent care for evaluation of vaginal odor for the last 3 days.  She is without urinary symptoms or other vaginal symptoms and denies abnormal vaginal discharge.  STI testing pending.  Patient declines HIV and RPR testing.  Declines pregnancy test in clinic today.  Plan to treat based on STI testing results.  We will call patient in the next 2 to 3 days once results come back if treatment is indicated at that time.   Patient's cough is consistent with viral upper respiratory infection.  Deferred imaging today based on stable cardiopulmonary exam.  She is afebrile at home and in clinic.  No clinical indication for group A  strep testing, COVID-19 testing, or influenza testing.  Patient to continue to use  over-the-counter medications for symptoms as symptoms are improving at this time.   Counseled patient regarding appropriate use of medications and potential side effects for all medications recommended or prescribed today. Discussed red flag signs and symptoms of worsening condition,when to call the PCP office, return to urgent care, and when to seek higher level of care. Patient verbalizes understanding and agreement with plan. All questions answered. Patient discharged in stable condition.  Final Clinical Impressions(s) / UC Diagnoses   Final diagnoses:  Sore throat  Screening for STD (sexually transmitted disease)     Discharge Instructions      STI testing is pending. We will call you in the next 2-3 days if your testing is positive. You will not hear from [place] if your testing is negative. Abstain from sexual intercourse until your STI testing comes back to avoid spread of STIs. Use condoms to prevent unwanted pregnancy and to prevent the spread of STIs.   If you develop any new or worsening symptoms or do not improve in the next 2 to 3 days, please return.  If your symptoms are severe, please go to the emergency room.  Follow-up with your primary care provider for further evaluation and management of your symptoms as well as ongoing wellness visits.  I hope you feel better!      ED Prescriptions   None    PDMP not reviewed this encounter.   Carlisle Beers, [place] 08/23/21 2143

## 2021-08-22 LAB — CERVICOVAGINAL ANCILLARY ONLY
Bacterial Vaginitis (gardnerella): POSITIVE — AB
Candida Glabrata: NEGATIVE
Candida Vaginitis: NEGATIVE
Chlamydia: NEGATIVE
Comment: NEGATIVE
Comment: NEGATIVE
Comment: NEGATIVE
Comment: NEGATIVE
Comment: NEGATIVE
Comment: NORMAL
Neisseria Gonorrhea: NEGATIVE
Trichomonas: NEGATIVE

## 2021-08-23 ENCOUNTER — Telehealth (HOSPITAL_COMMUNITY): Payer: Self-pay | Admitting: Emergency Medicine

## 2021-08-23 MED ORDER — METRONIDAZOLE 500 MG PO TABS: 500.0000 mg | ORAL_TABLET | Freq: Two times a day (BID) | ORAL | 0 refills | Status: AC

## 2021-08-25 ENCOUNTER — Encounter (HOSPITAL_COMMUNITY): Payer: Self-pay
